# Patient Record
Sex: Female | Born: 1970
Health system: Southern US, Community
[De-identification: ages and names within clinical notes are randomized; demographics above are authoritative.]

## PROBLEM LIST (undated history)

## (undated) DIAGNOSIS — F419 Anxiety disorder, unspecified: Secondary | ICD-10-CM

## (undated) DIAGNOSIS — K219 Gastro-esophageal reflux disease without esophagitis: Secondary | ICD-10-CM

## (undated) DIAGNOSIS — M25569 Pain in unspecified knee: Secondary | ICD-10-CM

## (undated) DIAGNOSIS — M549 Dorsalgia, unspecified: Secondary | ICD-10-CM

## (undated) DIAGNOSIS — J302 Other seasonal allergic rhinitis: Secondary | ICD-10-CM

## (undated) DIAGNOSIS — G8929 Other chronic pain: Secondary | ICD-10-CM

## (undated) HISTORY — PX: TUBAL LIGATION: SHX77

## (undated) HISTORY — PX: KNEE ARTHROSCOPY: SUR90

---

## 2011-09-24 ENCOUNTER — Emergency Department (HOSPITAL_COMMUNITY)
Admission: EM | Admit: 2011-09-24 | Discharge: 2011-09-24 | Disposition: A | Discharge status: Home / Self Care | Payer: 59 | Attending: Emergency Medicine | Admitting: Emergency Medicine

## 2011-09-24 ENCOUNTER — Encounter (HOSPITAL_COMMUNITY): Payer: Self-pay | Admitting: Emergency Medicine

## 2011-09-24 ENCOUNTER — Emergency Department (HOSPITAL_COMMUNITY): Payer: 59

## 2011-09-24 DIAGNOSIS — M25559 Pain in unspecified hip: Secondary | ICD-10-CM | POA: Insufficient documentation

## 2011-09-24 DIAGNOSIS — M25569 Pain in unspecified knee: Secondary | ICD-10-CM | POA: Insufficient documentation

## 2011-09-24 DIAGNOSIS — IMO0002 Reserved for concepts with insufficient information to code with codable children: Secondary | ICD-10-CM

## 2011-09-24 DIAGNOSIS — K219 Gastro-esophageal reflux disease without esophagitis: Secondary | ICD-10-CM | POA: Insufficient documentation

## 2011-09-24 DIAGNOSIS — W19XXXA Unspecified fall, initial encounter: Secondary | ICD-10-CM

## 2011-09-24 DIAGNOSIS — G8929 Other chronic pain: Secondary | ICD-10-CM | POA: Insufficient documentation

## 2011-09-24 HISTORY — DX: Anxiety disorder, unspecified: F41.9

## 2011-09-24 HISTORY — DX: Other seasonal allergic rhinitis: J30.2

## 2011-09-24 HISTORY — DX: Other chronic pain: G89.29

## 2011-09-24 HISTORY — DX: Pain in unspecified knee: M25.569

## 2011-09-24 HISTORY — DX: Gastro-esophageal reflux disease without esophagitis: K21.9
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
## 2011-09-24 NOTE — ED Notes (Signed)
Patient transported to X-ray 

## 2012-01-01 ENCOUNTER — Other Ambulatory Visit (HOSPITAL_COMMUNITY): Payer: Self-pay | Admitting: Pulmonary Disease

## 2012-01-01 DIAGNOSIS — Z1231 Encounter for screening mammogram for malignant neoplasm of breast: Secondary | ICD-10-CM

## 2012-01-05 ENCOUNTER — Ambulatory Visit (HOSPITAL_COMMUNITY)
Admission: RE | Admit: 2012-01-05 | Discharge: 2012-01-05 | Disposition: A | Discharge status: Home / Self Care | Payer: 59 | Source: Ambulatory Visit | Attending: Pulmonary Disease | Admitting: Pulmonary Disease

## 2012-01-05 DIAGNOSIS — Z1231 Encounter for screening mammogram for malignant neoplasm of breast: Secondary | ICD-10-CM | POA: Insufficient documentation

## 2012-03-05 ENCOUNTER — Ambulatory Visit (HOSPITAL_COMMUNITY)
Admission: RE | Admit: 2012-03-05 | Discharge: 2012-03-05 | Disposition: A | Discharge status: Home / Self Care | Payer: 59 | Source: Ambulatory Visit | Attending: Pulmonary Disease | Admitting: Pulmonary Disease

## 2012-03-05 ENCOUNTER — Other Ambulatory Visit (HOSPITAL_COMMUNITY): Payer: Self-pay | Admitting: Pulmonary Disease

## 2012-03-05 DIAGNOSIS — R109 Unspecified abdominal pain: Secondary | ICD-10-CM

## 2012-03-05 MED ORDER — IOHEXOL 300 MG/ML  SOLN
100.0000 mL | Freq: Once | INTRAMUSCULAR | Status: AC | PRN
  Administered 2012-03-05: 100 mL via INTRAVENOUS

## 2012-04-08 ENCOUNTER — Ambulatory Visit (INDEPENDENT_AMBULATORY_CARE_PROVIDER_SITE_OTHER): Payer: 59 | Admitting: Internal Medicine

## 2012-04-16 ENCOUNTER — Ambulatory Visit: Payer: 59 | Admitting: Urology

## 2012-05-09 ENCOUNTER — Ambulatory Visit (HOSPITAL_COMMUNITY)
Admission: RE | Admit: 2012-05-09 | Discharge: 2012-05-09 | Disposition: A | Discharge status: Home / Self Care | Payer: 59 | Source: Ambulatory Visit | Attending: Pulmonary Disease | Admitting: Pulmonary Disease

## 2012-05-09 ENCOUNTER — Encounter (HOSPITAL_COMMUNITY): Payer: Self-pay

## 2012-05-09 ENCOUNTER — Other Ambulatory Visit (HOSPITAL_COMMUNITY): Payer: Self-pay | Admitting: Pulmonary Disease

## 2012-05-09 DIAGNOSIS — R55 Syncope and collapse: Secondary | ICD-10-CM | POA: Insufficient documentation

## 2012-05-09 DIAGNOSIS — R51 Headache: Secondary | ICD-10-CM | POA: Insufficient documentation

## 2012-05-09 DIAGNOSIS — R42 Dizziness and giddiness: Secondary | ICD-10-CM

## 2012-05-21 ENCOUNTER — Ambulatory Visit (INDEPENDENT_AMBULATORY_CARE_PROVIDER_SITE_OTHER): Payer: 59 | Admitting: Internal Medicine

## 2012-05-28 ENCOUNTER — Ambulatory Visit (INDEPENDENT_AMBULATORY_CARE_PROVIDER_SITE_OTHER): Payer: 59 | Admitting: Internal Medicine

## 2012-07-10 ENCOUNTER — Other Ambulatory Visit (HOSPITAL_COMMUNITY): Payer: Self-pay | Admitting: Pulmonary Disease

## 2012-07-10 DIAGNOSIS — M545 Low back pain, unspecified: Secondary | ICD-10-CM

## 2012-07-11 ENCOUNTER — Ambulatory Visit (HOSPITAL_COMMUNITY)
Admission: RE | Admit: 2012-07-11 | Discharge: 2012-07-11 | Disposition: A | Discharge status: Home / Self Care | Payer: 59 | Source: Ambulatory Visit | Attending: Pulmonary Disease | Admitting: Pulmonary Disease

## 2012-07-11 ENCOUNTER — Other Ambulatory Visit (HOSPITAL_COMMUNITY): Payer: Self-pay | Admitting: Pulmonary Disease

## 2012-07-11 DIAGNOSIS — M5126 Other intervertebral disc displacement, lumbar region: Secondary | ICD-10-CM | POA: Insufficient documentation

## 2012-07-11 DIAGNOSIS — M545 Low back pain, unspecified: Secondary | ICD-10-CM

## 2012-07-25 ENCOUNTER — Ambulatory Visit (INDEPENDENT_AMBULATORY_CARE_PROVIDER_SITE_OTHER): Payer: 59 | Admitting: Otolaryngology

## 2012-07-25 DIAGNOSIS — H6120 Impacted cerumen, unspecified ear: Secondary | ICD-10-CM

## 2012-07-25 DIAGNOSIS — H8110 Benign paroxysmal vertigo, unspecified ear: Secondary | ICD-10-CM

## 2012-07-25 DIAGNOSIS — R42 Dizziness and giddiness: Secondary | ICD-10-CM

## 2012-11-26 ENCOUNTER — Encounter (HOSPITAL_COMMUNITY): Payer: Self-pay | Admitting: Nurse Practitioner

## 2012-11-26 ENCOUNTER — Emergency Department (HOSPITAL_COMMUNITY)
Admission: EM | Admit: 2012-11-26 | Discharge: 2012-11-26 | Disposition: A | Discharge status: Home / Self Care | Payer: 59 | Attending: Emergency Medicine | Admitting: Emergency Medicine

## 2012-11-26 DIAGNOSIS — IMO0002 Reserved for concepts with insufficient information to code with codable children: Secondary | ICD-10-CM | POA: Insufficient documentation

## 2012-11-26 DIAGNOSIS — R209 Unspecified disturbances of skin sensation: Secondary | ICD-10-CM | POA: Insufficient documentation

## 2012-11-26 DIAGNOSIS — R52 Pain, unspecified: Secondary | ICD-10-CM | POA: Insufficient documentation

## 2012-11-26 DIAGNOSIS — Z79899 Other long term (current) drug therapy: Secondary | ICD-10-CM | POA: Insufficient documentation

## 2012-11-26 DIAGNOSIS — F411 Generalized anxiety disorder: Secondary | ICD-10-CM | POA: Insufficient documentation

## 2012-11-26 DIAGNOSIS — G8929 Other chronic pain: Secondary | ICD-10-CM | POA: Insufficient documentation

## 2012-11-26 DIAGNOSIS — K219 Gastro-esophageal reflux disease without esophagitis: Secondary | ICD-10-CM | POA: Insufficient documentation

## 2012-11-26 DIAGNOSIS — M5410 Radiculopathy, site unspecified: Secondary | ICD-10-CM

## 2012-11-26 HISTORY — DX: Dorsalgia, unspecified: M54.9

## 2012-11-26 MED ORDER — ONDANSETRON 4 MG PO TBDP
8.0000 mg | ORAL_TABLET | Freq: Once | ORAL | Status: AC
  Administered 2012-11-26: 8 mg via ORAL
  Filled 2012-11-26: qty 2

## 2012-11-26 MED ORDER — DIAZEPAM 5 MG PO TABS: 5.0000 mg | ORAL_TABLET | Freq: Three times a day (TID) | ORAL | Status: DC | PRN

## 2012-11-26 MED ORDER — OXYCODONE-ACETAMINOPHEN 5-325 MG PO TABS: 1.0000 | ORAL_TABLET | ORAL | Status: DC | PRN

## 2012-11-26 MED ORDER — HYDROMORPHONE HCL PF 2 MG/ML IJ SOLN
2.0000 mg | Freq: Once | INTRAMUSCULAR | Status: AC
  Administered 2012-11-26: 2 mg via INTRAMUSCULAR
  Filled 2012-11-26: qty 1

## 2012-11-26 NOTE — ED Provider Notes (Signed)
Medical screening examination/treatment/procedure(s) were performed by non-physician practitioner and as supervising physician I was immediately available for consultation/collaboration.  Shamell Hittle L Lunah Losasso, MD 11/26/12 1647 

## 2012-11-26 NOTE — ED Notes (Signed)
I gave the patient a cup of ice and a ginger-ale. 

## 2012-11-26 NOTE — ED Notes (Signed)
Pt reports she has chronic back pain. She pulled a heavy cooler into the house yesterday and then woke at 0300 with severe lower back pain and numbness and tingling in both feet and down her R leg. Denies bowel/bladder changes. Pt states she can not ambulate due to pain

## 2012-11-26 NOTE — ED Provider Notes (Signed)
CSN: [id number]     Arrival date & time 11/26/12  1129 History     First MD Initiated Contact with Patient 11/26/12 1246     Chief Complaint  Patient presents with  . 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  States she dragged a full cooler yesterday and eacerbated her pain.  Wore up at 3am with significant pain and numbness in bilateral feet.  States she is no longer numb in her feet.  Pain is sharp ,radiates down right leg to level of the knee.  Denies fevers, chills, abdominal pain, N/V/D, change in bowel habits, urinary or vaginal symptoms.  Took home medications without improvement.  Sees back specialist Dr Ollen Bowl.  Patient is a 43 y.o. female presenting with back pain. The history is provided by the patient.  Back Pain Associated symptoms: no abdominal pain, no dysuria and no fever     Past Medical History  Diagnosis Date  . GERD (gastroesophageal reflux disease)   . Anxiety   . Chronic knee pain   . Seasonal allergies   . Back pain    Past Surgical History  Procedure Laterality Date  . Knee arthroscopy    . Tubal ligation     Family History  Problem Relation Age of Onset  . Hypertension Other    History  Substance Use Topics  . Smoking status: Never Smoker   . Smokeless tobacco: Never Used  . Alcohol Use: No   OB History   Grav Para Term Preterm Abortions TAB SAB Ect Mult Living   3 1 1  2  2   1      Review of Systems  Constitutional: Negative for fever.  Gastrointestinal: Negative for nausea, vomiting, abdominal pain and diarrhea.  Genitourinary: Negative for dysuria, urgency, frequency, vaginal bleeding and vaginal discharge.  Musculoskeletal: Positive for back pain.    Allergies  Review of patient's allergies indicates no known allergies.  Home  Medications   Current Outpatient Rx  Name  Route  Sig  Dispense  Refill  . citalopram (CELEXA) 10 MG tablet   Oral   Take 10 mg by mouth daily.         Marland Kitchen esomeprazole (NEXIUM) 40 MG capsule   Oral   Take 40 mg by mouth daily before breakfast.         . HYDROcodone-acetaminophen (NORCO) 5-325 MG per tablet   Oral   Take 1 tablet by mouth as needed. pain         . naproxen sodium (ALEVE) 220 MG tablet   Oral   Take 440 mg by mouth daily.          BP 110/73  Pulse 81  Temp(Src) 98.2 F (36.8 C) (Oral)  Resp 16  Ht 5\' 7"  (1.702 m)  Wt 210 lb (95.255 kg)  BMI 32.88 kg/m2  SpO2 97%  LMP 11/06/2012 Physical Exam  Nursing note and vitals reviewed. Constitutional: She appears well-developed and well-nourished. No distress.  HENT:  Head: Normocephalic and atraumatic.  Neck: Neck supple.  Pulmonary/Chest: Effort normal.  Abdominal: Bowel sounds are normal. She exhibits no distension and no mass. There is no tenderness. There is no rebound and no guarding.  Musculoskeletal:       Back:  Lower extremities:  Strength 5/5, sensation intact, distal pulses intact.    Spine with diffuse tenderness  over lumbar area, no crepitus, or steopoffs. No skin changes overlying spine.     Neurological: She is alert.  Skin: She is not diaphoretic.    ED Course   Procedures (including critical care time)  Labs Reviewed - No data to display No results found.  2:16 PM Pt feeling much better, able to ambulate now.  Ambulates to door and back without difficulty.    1. Radicular low back pain     MDM  Pt with acute on chronic back pain with injury yesterday, dragging heavy cooler.  Initermittent numbness and uncontrolled pain.  Pain is in the same place and same quality as chronic pain, controlled here.  No weakness.  Neurovascularly intact.  No emergent need for imagining today.  Neurosurgical follow up.  Discussed  findings, treatment, follow up  with patient.  Pt given return  precautions.  Pt verbalizes understanding and agrees with plan.     Trixie Dredge, PA-C 11/26/12 1452

## 2012-12-05 ENCOUNTER — Ambulatory Visit (INDEPENDENT_AMBULATORY_CARE_PROVIDER_SITE_OTHER): Payer: 59 | Admitting: Otolaryngology

## 2013-01-29 ENCOUNTER — Ambulatory Visit (HOSPITAL_COMMUNITY)
Admission: RE | Admit: 2013-01-29 | Discharge: 2013-01-29 | Disposition: A | Discharge status: Home / Self Care | Payer: 59 | Source: Ambulatory Visit | Attending: Pulmonary Disease | Admitting: Pulmonary Disease

## 2013-01-29 ENCOUNTER — Other Ambulatory Visit (HOSPITAL_COMMUNITY): Payer: Self-pay | Admitting: Pulmonary Disease

## 2013-01-29 DIAGNOSIS — M25519 Pain in unspecified shoulder: Secondary | ICD-10-CM | POA: Insufficient documentation

## 2013-01-29 DIAGNOSIS — M25511 Pain in right shoulder: Secondary | ICD-10-CM

## 2013-03-29 ENCOUNTER — Emergency Department (HOSPITAL_COMMUNITY)
Admission: EM | Admit: 2013-03-29 | Discharge: 2013-03-29 | Disposition: A | Discharge status: Home / Self Care | Payer: 59 | Source: Home / Self Care | Attending: Family Medicine | Admitting: Family Medicine

## 2013-03-29 ENCOUNTER — Encounter (HOSPITAL_COMMUNITY): Payer: Self-pay | Admitting: Emergency Medicine

## 2013-03-29 DIAGNOSIS — J040 Acute laryngitis: Secondary | ICD-10-CM

## 2013-03-29 LAB — POCT RAPID STREP A: Streptococcus, Group A Screen (Direct): NEGATIVE

## 2013-03-29 MED ORDER — SODIUM CHLORIDE 0.9 % IN NEBU
INHALATION_SOLUTION | RESPIRATORY_TRACT | Status: AC
  Filled 2013-03-29: qty 3

## 2013-03-29 MED ORDER — ALBUTEROL SULFATE (5 MG/ML) 0.5% IN NEBU
5.0000 mg | INHALATION_SOLUTION | Freq: Once | RESPIRATORY_TRACT | Status: AC
  Administered 2013-03-29: 5 mg via RESPIRATORY_TRACT

## 2013-03-29 MED ORDER — METHYLPREDNISOLONE ACETATE 80 MG/ML IJ SUSP
INTRAMUSCULAR | Status: AC
  Filled 2013-03-29: qty 1

## 2013-03-29 MED ORDER — METHYLPREDNISOLONE ACETATE 80 MG/ML IJ SUSP
80.0000 mg | Freq: Once | INTRAMUSCULAR | Status: AC
  Administered 2013-03-29: 80 mg via INTRAMUSCULAR

## 2013-03-29 MED ORDER — GUAIFENESIN-CODEINE 100-10 MG/5ML PO SOLN: 5.0000 mL | Freq: Every evening | ORAL | Status: DC | PRN

## 2013-03-29 MED ORDER — FLUCONAZOLE 150 MG PO TABS: 150.0000 mg | ORAL_TABLET | Freq: Once | ORAL | Status: DC

## 2013-03-29 MED ORDER — ALBUTEROL SULFATE (5 MG/ML) 0.5% IN NEBU: 2.5000 mg | INHALATION_SOLUTION | Freq: Four times a day (QID) | RESPIRATORY_TRACT | Status: DC | PRN

## 2013-03-29 MED ORDER — ALBUTEROL SULFATE (5 MG/ML) 0.5% IN NEBU
INHALATION_SOLUTION | RESPIRATORY_TRACT | Status: AC
  Filled 2013-03-29: qty 1

## 2013-03-29 MED ORDER — IPRATROPIUM BROMIDE 0.02 % IN SOLN
RESPIRATORY_TRACT | Status: AC
  Filled 2013-03-29: qty 2.5

## 2013-03-29 MED ORDER — PREDNISONE 50 MG PO TABS: 50.0000 mg | ORAL_TABLET | Freq: Every day | ORAL | Status: DC

## 2013-03-29 MED ORDER — IPRATROPIUM BROMIDE 0.02 % IN SOLN
0.5000 mg | Freq: Once | RESPIRATORY_TRACT | Status: AC
  Administered 2013-03-29: 0.5 mg via RESPIRATORY_TRACT

## 2013-03-29 MED ORDER — AZITHROMYCIN 250 MG PO TABS: 250.0000 mg | ORAL_TABLET | Freq: Every day | ORAL | Status: DC

## 2013-03-29 NOTE — ED Provider Notes (Addendum)
Sabrina Wood is a 42 y.o. female who presents to Urgent Care today for sore throat cough congestion hoarse voice. This is been present for 6 days. Hoarse voice started yesterday. Patient additionally notes some inspiratory stridor. She had a fever up to 101.7 earlier this week. The fever has resolved. Her primary care provider called her in some Keflex which she has been taking which has not helped. She has been taking Sudafed Mucinex and Robitussin which helped only a little. 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 She has not been to work this week. She denies any nausea vomiting diarrhea or shortness of breath. Additionally she notes a vaginal yeast infection starting since she has been taking antibiotics and would like some Diflucan for this if possible.    Past Medical History  Diagnosis Date  . GERD (gastroesophageal reflux disease)   . Anxiety   . Chronic knee pain   . Seasonal allergies   . Back pain    History  Substance Use Topics  . Smoking status: Never Smoker   . Smokeless tobacco: Never Used  . Alcohol Use: No   ROS as above Medications reviewed. Current Facility-Administered Medications  Medication Dose Route Frequency Provider Last Rate Last Dose  . ipratropium (ATROVENT) nebulizer solution 0.5 mg  0.5 mg Nebulization Once Rodolph Bong, MD       And  . albuterol (PROVENTIL) (5 MG/ML) 0.5% nebulizer solution 5 mg  5 mg Nebulization Once Rodolph Bong, MD      . methylPREDNISolone acetate (DEPO-MEDROL) injection 80 mg  80 mg Intramuscular Once Rodolph Bong, MD       Current Outpatient Prescriptions  Medication Sig Dispense Refill  . Biotin 5000 MCG TABS Take 5,000 mcg by mouth daily.       . citalopram (CELEXA) 20 MG tablet Take 20 mg by mouth daily.      . COCONUT OIL PO Take 1 tablet by mouth daily.       . diazepam (VALIUM) 5 MG tablet Take 1 tablet (5 mg total) by mouth every 8 (eight) hours as needed (muscle spasm or pain).  15  tablet  0  . esomeprazole (NEXIUM) 40 MG capsule Take 40 mg by mouth 2 (two) times daily.       Marland Kitchen HYDROcodone-acetaminophen (NORCO) 7.5-325 MG per tablet Take 1 tablet by mouth every 6 (six) hours as needed for pain.      Marland Kitchen loratadine (CLARITIN) 10 MG tablet Take 10 mg by mouth daily as needed for allergies.      Marland Kitchen oxyCODONE-acetaminophen (PERCOCET) 5-325 MG per tablet Take 1-2 tablets by mouth every 4 (four) hours as needed for pain.  15 tablet  0    Exam:  BP 127/78  Pulse 78  Temp(Src) 98.7 F (37.1 C) (Oral)  Resp 16  SpO2 98% Gen: Well NAD HEENT: EOMI,  MMM, mild inspiratory stridor. Lungs: Normal work of breathing. CTABL no wheezing in the lungs noted Heart: RRR no MRG Abd: NABS, Soft. NT, ND Exts: Non edematous BL  LE, warm and well perfused.   Patient was given a DuoNeb nebulizer treatment, and 80 mg of IM Depo-Medrol, and had some improvement in symptoms  Results for orders placed during the hospital encounter of 03/29/13 (from the past 24 hour(s))  POCT RAPID STREP A ([facility] ONLY)     Status: None   Collection Time    03/29/13  2:09 PM  Result Value Range   Streptococcus, Group A Screen (Direct) NEGATIVE  NEGATIVE   No results found.  Assessment and Plan: 42 y.o. female with laryngitis. This is likely a viral process however I will treat with azithromycin antibiotics to cover atypical organisms. However the most important part of therapy will be prednisone. Will use this as well as codeine containing cough medication. I provided patient with home nebulizer albuterol rx.  Follow up with PCP Discussed warning signs or symptoms. Please see discharge instructions. Patient expresses understanding.   Additionally we'll prescribe fluconazole for patient's yeast infection.  Rodolph Bong, MD 03/29/13 1501  Rodolph Bong, MD 03/29/13 [phone]

## 2013-03-29 NOTE — ED Notes (Signed)
Sore throat since 12-15; her MD called in Rx w/o exam, and has now developed yeast infection and has lost her voice due to cough

## 2013-03-31 LAB — CULTURE, GROUP A STREP

## 2013-04-15 ENCOUNTER — Ambulatory Visit (HOSPITAL_COMMUNITY)
Admission: RE | Admit: 2013-04-15 | Discharge: 2013-04-15 | Disposition: A | Discharge status: Home / Self Care | Payer: 59 | Source: Ambulatory Visit | Attending: Pulmonary Disease | Admitting: Pulmonary Disease

## 2013-04-15 ENCOUNTER — Other Ambulatory Visit (HOSPITAL_COMMUNITY): Payer: Self-pay | Admitting: Pulmonary Disease

## 2013-04-15 ENCOUNTER — Encounter (HOSPITAL_COMMUNITY): Payer: Self-pay

## 2013-04-15 DIAGNOSIS — R51 Headache: Principal | ICD-10-CM

## 2013-04-15 DIAGNOSIS — R519 Headache, unspecified: Secondary | ICD-10-CM

## 2013-04-15 DIAGNOSIS — R569 Unspecified convulsions: Secondary | ICD-10-CM | POA: Insufficient documentation

## 2013-04-16 ENCOUNTER — Ambulatory Visit (HOSPITAL_COMMUNITY): Payer: 59

## 2013-04-29 ENCOUNTER — Other Ambulatory Visit (HOSPITAL_COMMUNITY): Payer: Self-pay | Admitting: Pulmonary Disease

## 2013-04-29 DIAGNOSIS — M25511 Pain in right shoulder: Secondary | ICD-10-CM

## 2013-04-29 DIAGNOSIS — M25569 Pain in unspecified knee: Secondary | ICD-10-CM

## 2013-04-29 DIAGNOSIS — M1990 Unspecified osteoarthritis, unspecified site: Secondary | ICD-10-CM

## 2013-04-29 DIAGNOSIS — G8929 Other chronic pain: Secondary | ICD-10-CM

## 2013-04-29 DIAGNOSIS — M549 Dorsalgia, unspecified: Secondary | ICD-10-CM

## 2013-05-02 ENCOUNTER — Encounter (HOSPITAL_COMMUNITY): Payer: Self-pay

## 2013-05-02 ENCOUNTER — Ambulatory Visit (HOSPITAL_COMMUNITY)
Admission: RE | Admit: 2013-05-02 | Discharge: 2013-05-02 | Disposition: A | Discharge status: Home / Self Care | Payer: 59 | Source: Ambulatory Visit | Attending: Pulmonary Disease | Admitting: Pulmonary Disease

## 2013-05-02 DIAGNOSIS — M25569 Pain in unspecified knee: Principal | ICD-10-CM

## 2013-05-02 DIAGNOSIS — M25511 Pain in right shoulder: Secondary | ICD-10-CM

## 2013-05-02 DIAGNOSIS — M1990 Unspecified osteoarthritis, unspecified site: Secondary | ICD-10-CM

## 2013-05-02 DIAGNOSIS — M259 Joint disorder, unspecified: Secondary | ICD-10-CM | POA: Insufficient documentation

## 2013-05-02 DIAGNOSIS — M549 Dorsalgia, unspecified: Secondary | ICD-10-CM

## 2013-05-02 DIAGNOSIS — M48061 Spinal stenosis, lumbar region without neurogenic claudication: Secondary | ICD-10-CM | POA: Insufficient documentation

## 2013-05-02 DIAGNOSIS — M5124 Other intervertebral disc displacement, thoracic region: Secondary | ICD-10-CM | POA: Insufficient documentation

## 2013-05-02 DIAGNOSIS — G8929 Other chronic pain: Secondary | ICD-10-CM

## 2013-05-02 DIAGNOSIS — M542 Cervicalgia: Secondary | ICD-10-CM | POA: Insufficient documentation

## 2013-05-02 DIAGNOSIS — M5030 Other cervical disc degeneration, unspecified cervical region: Secondary | ICD-10-CM | POA: Insufficient documentation

## 2013-05-02 DIAGNOSIS — M545 Low back pain, unspecified: Secondary | ICD-10-CM | POA: Insufficient documentation

## 2013-05-02 DIAGNOSIS — M5126 Other intervertebral disc displacement, lumbar region: Secondary | ICD-10-CM | POA: Insufficient documentation

## 2013-05-02 DIAGNOSIS — M5137 Other intervertebral disc degeneration, lumbosacral region: Secondary | ICD-10-CM | POA: Insufficient documentation

## 2013-05-02 DIAGNOSIS — M51379 Other intervertebral disc degeneration, lumbosacral region without mention of lumbar back pain or lower extremity pain: Secondary | ICD-10-CM | POA: Insufficient documentation

## 2013-05-02 DIAGNOSIS — M5020 Other cervical disc displacement, unspecified cervical region: Secondary | ICD-10-CM | POA: Insufficient documentation

## 2014-02-02 ENCOUNTER — Other Ambulatory Visit (HOSPITAL_COMMUNITY): Payer: Self-pay | Admitting: Pulmonary Disease

## 2014-02-02 DIAGNOSIS — Z1231 Encounter for screening mammogram for malignant neoplasm of breast: Secondary | ICD-10-CM

## 2014-02-09 ENCOUNTER — Encounter (HOSPITAL_COMMUNITY): Payer: Self-pay

## 2014-03-02 ENCOUNTER — Ambulatory Visit (HOSPITAL_COMMUNITY): Payer: 59

## 2014-03-18 ENCOUNTER — Ambulatory Visit (HOSPITAL_COMMUNITY): Payer: 59

## 2014-03-18 ENCOUNTER — Ambulatory Visit (HOSPITAL_COMMUNITY)
Admission: RE | Admit: 2014-03-18 | Discharge: 2014-03-18 | Disposition: A | Discharge status: Home / Self Care | Payer: 59 | Source: Ambulatory Visit | Attending: Pulmonary Disease | Admitting: Pulmonary Disease

## 2014-03-18 DIAGNOSIS — Z1231 Encounter for screening mammogram for malignant neoplasm of breast: Secondary | ICD-10-CM | POA: Diagnosis present

## 2014-06-19 ENCOUNTER — Other Ambulatory Visit (HOSPITAL_COMMUNITY): Payer: Self-pay | Admitting: Pulmonary Disease

## 2014-06-19 ENCOUNTER — Ambulatory Visit (HOSPITAL_COMMUNITY)
Admission: RE | Admit: 2014-06-19 | Discharge: 2014-06-19 | Disposition: A | Discharge status: Home / Self Care | Payer: 59 | Source: Ambulatory Visit | Attending: Pulmonary Disease | Admitting: Pulmonary Disease

## 2014-06-19 DIAGNOSIS — W19XXXA Unspecified fall, initial encounter: Secondary | ICD-10-CM | POA: Diagnosis not present

## 2014-06-19 DIAGNOSIS — S8991XA Unspecified injury of right lower leg, initial encounter: Secondary | ICD-10-CM | POA: Insufficient documentation

## 2014-07-13 ENCOUNTER — Encounter (HOSPITAL_COMMUNITY): Payer: Self-pay | Admitting: Emergency Medicine

## 2014-07-13 ENCOUNTER — Emergency Department (HOSPITAL_COMMUNITY)
Admission: EM | Admit: 2014-07-13 | Discharge: 2014-07-13 | Disposition: A | Discharge status: Home / Self Care | Payer: 59 | Attending: Emergency Medicine | Admitting: Emergency Medicine

## 2014-07-13 ENCOUNTER — Emergency Department (HOSPITAL_COMMUNITY): Payer: 59

## 2014-07-13 DIAGNOSIS — Z7952 Long term (current) use of systemic steroids: Secondary | ICD-10-CM | POA: Insufficient documentation

## 2014-07-13 DIAGNOSIS — B349 Viral infection, unspecified: Secondary | ICD-10-CM | POA: Insufficient documentation

## 2014-07-13 DIAGNOSIS — K219 Gastro-esophageal reflux disease without esophagitis: Secondary | ICD-10-CM | POA: Insufficient documentation

## 2014-07-13 DIAGNOSIS — Z79899 Other long term (current) drug therapy: Secondary | ICD-10-CM | POA: Insufficient documentation

## 2014-07-13 DIAGNOSIS — F419 Anxiety disorder, unspecified: Secondary | ICD-10-CM | POA: Diagnosis not present

## 2014-07-13 DIAGNOSIS — G8929 Other chronic pain: Secondary | ICD-10-CM | POA: Insufficient documentation

## 2014-07-13 DIAGNOSIS — R1110 Vomiting, unspecified: Secondary | ICD-10-CM | POA: Diagnosis present

## 2014-07-13 DIAGNOSIS — R059 Cough, unspecified: Secondary | ICD-10-CM

## 2014-07-13 DIAGNOSIS — R05 Cough: Secondary | ICD-10-CM

## 2014-07-13 LAB — CBC WITH DIFFERENTIAL/PLATELET
Basophils Absolute: 0 10*3/uL (ref 0.0–0.1)
Basophils Relative: 1 % (ref 0–1)
Eosinophils Absolute: 0.1 10*3/uL (ref 0.0–0.7)
Eosinophils Relative: 2 % (ref 0–5)
HCT: 40 % (ref 36.0–46.0)
Hemoglobin: 13.4 g/dL (ref 12.0–15.0)
Lymphocytes Relative: 24 % (ref 12–46)
Lymphs Abs: 1 10*3/uL (ref 0.7–4.0)
MCH: 31.3 pg (ref 26.0–34.0)
MCHC: 33.5 g/dL (ref 30.0–36.0)
MCV: 93.5 fL (ref 78.0–100.0)
Monocytes Absolute: 0.8 10*3/uL (ref 0.1–1.0)
Monocytes Relative: 20 % — ABNORMAL HIGH (ref 3–12)
Neutro Abs: 2.3 10*3/uL (ref 1.7–7.7)
Neutrophils Relative %: 53 % (ref 43–77)
Platelets: 317 10*3/uL (ref 150–400)
RBC: 4.28 MIL/uL (ref 3.87–5.11)
RDW: 12.8 % (ref 11.5–15.5)
WBC: 4.3 10*3/uL (ref 4.0–10.5)

## 2014-07-13 LAB — COMPREHENSIVE METABOLIC PANEL
ALT: 13 U/L (ref 0–35)
AST: 16 U/L (ref 0–37)
Albumin: 3.7 g/dL (ref 3.5–5.2)
Alkaline Phosphatase: 61 U/L (ref 39–117)
Anion gap: 7 (ref 5–15)
BUN: 7 mg/dL (ref 6–23)
CO2: 25 mmol/L (ref 19–32)
Calcium: 8.7 mg/dL (ref 8.4–10.5)
Chloride: 107 mmol/L (ref 96–112)
Creatinine, Ser: 0.77 mg/dL (ref 0.50–1.10)
GFR calc Af Amer: >90 mL/min (ref >90)
GFR calc non Af Amer: >90 mL/min (ref >90)
Glucose, Bld: 97 mg/dL (ref 70–99)
Potassium: 3.6 mmol/L (ref 3.5–5.1)
Sodium: 139 mmol/L (ref 135–145)
Total Bilirubin: 0.4 mg/dL (ref 0.3–1.2)
Total Protein: 7.1 g/dL (ref 6.0–8.3)

## 2014-07-13 LAB — URINALYSIS, ROUTINE W REFLEX MICROSCOPIC
Bilirubin Urine: NEGATIVE
Glucose, UA: NEGATIVE mg/dL
Ketones, ur: NEGATIVE mg/dL
Leukocytes, UA: NEGATIVE
Nitrite: NEGATIVE
Protein, ur: NEGATIVE mg/dL
Specific Gravity, Urine: >1.03 — ABNORMAL HIGH (ref 1.005–1.030)
Urobilinogen, UA: 0.2 mg/dL (ref 0.0–1.0)
pH: 6 (ref 5.0–8.0)

## 2014-07-13 LAB — LIPASE, BLOOD: Lipase: 22 U/L (ref 11–59)

## 2014-07-13 LAB — URINE MICROSCOPIC-ADD ON

## 2014-07-13 MED ORDER — DIPHENHYDRAMINE HCL 50 MG/ML IJ SOLN
25.0000 mg | Freq: Once | INTRAMUSCULAR | Status: AC
  Administered 2014-07-13: 25 mg via INTRAVENOUS
  Filled 2014-07-13: qty 1

## 2014-07-13 MED ORDER — SODIUM CHLORIDE 0.9 % IV SOLN
INTRAVENOUS | Status: DC
  Administered 2014-07-13: 12:00:00 via INTRAVENOUS

## 2014-07-13 MED ORDER — METOCLOPRAMIDE HCL 5 MG/ML IJ SOLN
10.0000 mg | Freq: Once | INTRAMUSCULAR | Status: AC
  Administered 2014-07-13: 10 mg via INTRAVENOUS
  Filled 2014-07-13: qty 2

## 2014-07-13 MED ORDER — ONDANSETRON HCL 4 MG/2ML IJ SOLN
4.0000 mg | Freq: Once | INTRAMUSCULAR | Status: AC
  Administered 2014-07-13: 4 mg via INTRAVENOUS
  Filled 2014-07-13: qty 2

## 2014-07-13 MED ORDER — KETOROLAC TROMETHAMINE 30 MG/ML IJ SOLN
30.0000 mg | Freq: Once | INTRAMUSCULAR | Status: AC
  Administered 2014-07-13: 30 mg via INTRAVENOUS
  Filled 2014-07-13: qty 1

## 2014-07-13 MED ORDER — SODIUM CHLORIDE 0.9 % IV BOLUS (SEPSIS)
1000.0000 mL | Freq: Once | INTRAVENOUS | Status: AC
  Administered 2014-07-13: 1000 mL via INTRAVENOUS

## 2014-07-13 NOTE — Discharge Instructions (Signed)

## 2014-07-13 NOTE — ED Notes (Signed)
PT c/o vomiting since 07/10/14 but denies any diarrhea and c/o of low grade fever. PT states husband with same symptoms last week.

## 2014-07-13 NOTE — ED Provider Notes (Signed)
CSN: [id number]     Arrival date & time 07/13/14  1059 History  This chart was scribed for Sabrina Nick, MD by Abel Presto, ED Scribe. This patient was seen in room APA08/APA08 and the patient's care was started at 12:32 PM.    Chief Complaint  Patient presents with  . Emesis    Patient is a 44 y.o. female presenting with vomiting. The history is provided by the patient. No language interpreter was used.  Emesis Associated symptoms: headaches and sore throat    HPI Comments: Sabrina Wood is a 45 y.o. female with PMHx of migraines who presents to the Emergency Department complaining of vomiting 5-6 times a day with onset 3 days ago. Pt notes associated fever at highest 102.1, congestion, headache, and cough, sore throat, and mild photophobia. No dyspnea. Pt states she feels like she has the flu. Pt tried Pepto Bismol, Imitrex, and Wal Phed PE for relief. Pt denies diarrhea, ear pain, and neck pain.no rashes noted. Pt notes positive sick exposures.  Past Medical History  Diagnosis Date  . GERD (gastroesophageal reflux disease)   . Anxiety   . Chronic knee pain   . Seasonal allergies   . Back pain    Past Surgical History  Procedure Laterality Date  . Knee arthroscopy    . Tubal ligation     Family History  Problem Relation Age of Onset  . Hypertension Other    History  Substance Use Topics  . Smoking status: Never Smoker   . Smokeless tobacco: Never Used  . Alcohol Use: No   OB History    Gravida Para Term Preterm AB TAB SAB Ectopic Multiple Living   Review of Systems  Constitutional: Positive for fever.  HENT: Positive for congestion and sore throat.   Eyes: Positive for photophobia.  Respiratory: Positive for cough.   Gastrointestinal: Positive for vomiting.  Neurological: Positive for headaches.  All other systems reviewed and are negative.     Allergies  Morphine and related  Home Medications   Prior to Admission medications    Medication Sig Start Date End Date Taking? Authorizing Provider  ALPRAZolam Prudy Feeler) 0.5 MG tablet Take 0.5 mg by mouth daily as needed for anxiety.   Yes Historical Provider, MD  Biotin 5000 MCG TABS Take 5,000 mcg by mouth daily.    Yes Historical Provider, MD  citalopram (CELEXA) 20 MG tablet Take 20 mg by mouth daily.   Yes Historical Provider, MD  COCONUT OIL PO Take 1 tablet by mouth daily.    Yes Historical Provider, MD  esomeprazole (NEXIUM) 40 MG capsule Take 40 mg by mouth 2 (two) times daily.    Yes Historical Provider, MD  loratadine (CLARITIN) 10 MG tablet Take 10 mg by mouth daily as needed for allergies.   Yes Historical Provider, MD  albuterol (PROVENTIL) (5 MG/ML) 0.5% nebulizer solution Take 0.5 mLs (2.5 mg total) by nebulization every 6 (six) hours as needed for wheezing or shortness of breath. Patient not taking: Reported on 07/13/2014 03/29/13   Rodolph Bong, MD  azithromycin (ZITHROMAX) 250 MG tablet Take 1 tablet (250 mg total) by mouth daily. Take first 2 tablets together, then 1 every day until finished. Patient not taking: Reported on 07/13/2014 03/29/13   Rodolph Bong, MD  diazepam (VALIUM) 5 MG tablet Take 1 tablet (5 mg total) by mouth every 8 (eight) hours as needed (muscle  spasm or pain). Patient not taking: Reported on 07/13/2014 11/26/12   Trixie DredgeEmily West, PA-C  fluconazole (DIFLUCAN) 150 MG tablet Take 1 tablet (150 mg total) by mouth once. Patient not taking: Reported on 07/13/2014 03/29/13   Rodolph BongEvan S Corey, MD  guaiFENesin-codeine 100-10 MG/5ML syrup Take 5 mLs by mouth at bedtime as needed for cough. Patient not taking: Reported on 07/13/2014 03/29/13   Rodolph BongEvan S Corey, MD  predniSONE (DELTASONE) 50 MG tablet Take 1 tablet (50 mg total) by mouth daily. Patient not taking: Reported on 07/13/2014 03/29/13   Rodolph BongEvan S Corey, MD   BP 127/80 mmHg  Pulse 100  Temp(Src) 97.8 F (36.6 C) (Oral)  Resp 18  Ht 5\' 6"  (1.676 m)  Wt 220 lb (99.791 kg)  BMI 35.53 kg/m2  SpO2 99%  LMP  06/30/2014 Physical Exam  Constitutional: She is oriented to person, place, and time. She appears well-developed and well-nourished.  Non-toxic appearance. No distress.  HENT:  Head: Normocephalic and atraumatic.  Eyes: Conjunctivae, EOM and lids are normal. Pupils are equal, round, and reactive to light.  Neck: Normal range of motion. Neck supple. No tracheal deviation present. No Brudzinski's sign and no Kernig's sign noted. No thyroid mass present.  No meningismus  Cardiovascular: Normal rate, regular rhythm and normal heart sounds.  Exam reveals no gallop.   No murmur heard. Pulmonary/Chest: Effort normal and breath sounds normal. No stridor. No respiratory distress. She has no decreased breath sounds. She has no wheezes. She has no rhonchi. She has no rales.  Abdominal: Soft. Normal appearance and bowel sounds are normal. She exhibits no distension. There is no tenderness. There is no rebound and no CVA tenderness.  Musculoskeletal: Normal range of motion. She exhibits no edema or tenderness.  Neurological: She is alert and oriented to person, place, and time. She has normal strength. No cranial nerve deficit or sensory deficit. GCS eye subscore is 4. GCS verbal subscore is 5. GCS motor subscore is 6.  Skin: Skin is warm and dry. No abrasion and no rash noted.  Psychiatric: She has a normal mood and affect. Her speech is normal and behavior is normal.  Nursing note and vitals reviewed.   ED Course  Procedures (including critical care time) DIAGNOSTIC STUDIES: Oxygen Saturation is 99% on room air, normal by my interpretation.    COORDINATION OF CARE: 12:37 PM Discussed treatment plan with patient at beside, the patient agrees with the plan and has no further questions at this time.   Labs Review Labs Reviewed  CBC WITH DIFFERENTIAL/PLATELET - Abnormal; Notable for the following:    Monocytes Relative 20 (*)    All other components within normal limits  COMPREHENSIVE METABOLIC  PANEL  LIPASE, BLOOD  URINALYSIS, ROUTINE W REFLEX MICROSCOPIC    Imaging Review No results found.   EKG Interpretation None      MDM   Final diagnoses:  Cough    I personally performed the services described in this documentation, which was scribed in my presence. The recorded information has been reviewed and is accurate.    Patient given IV fluids and medications and feels less better at this time. No evidence of meningitis at this time. Suspect viral illness. Stable for discharge  Sabrina NickAnthony Myeisha Kruser, MD 07/13/14 1419

## 2015-02-08 ENCOUNTER — Other Ambulatory Visit (HOSPITAL_COMMUNITY): Payer: Self-pay | Admitting: Pulmonary Disease

## 2015-02-08 DIAGNOSIS — Z1231 Encounter for screening mammogram for malignant neoplasm of breast: Secondary | ICD-10-CM

## 2015-03-26 ENCOUNTER — Ambulatory Visit (HOSPITAL_COMMUNITY)
Admission: RE | Admit: 2015-03-26 | Discharge: 2015-03-26 | Disposition: A | Discharge status: Home / Self Care | Payer: 59 | Source: Ambulatory Visit | Attending: Pulmonary Disease | Admitting: Pulmonary Disease

## 2015-03-26 DIAGNOSIS — Z1231 Encounter for screening mammogram for malignant neoplasm of breast: Secondary | ICD-10-CM | POA: Diagnosis not present

## 2015-05-24 MED FILL — CITALOPRAM HBR 20 MG TABLET: 20 | 90 days supply | Qty: 180 | Fill #0

## 2015-05-28 MED FILL — ALPRAZolam 1 MG TABS: 1 | 90 days supply | Qty: 180 | Fill #0

## 2015-07-30 MED FILL — ESOMEPRAZOLE MAG DR 40 MG C: 40 | 90 days supply | Qty: 180 | Fill #0

## 2015-09-08 MED FILL — CITALOPRAM HBR 20 MG TABLET: 20 | 90 days supply | Qty: 180 | Fill #1

## 2015-12-31 MED FILL — CITALOPRAM HBR 20 MG TABLET: 20 | 90 days supply | Qty: 180 | Fill #0 | Status: TO

## 2015-12-31 MED FILL — ESOMEPRAZOLE MAG DR 40 MG C: 40 | 90 days supply | Qty: 180 | Fill #1

## 2016-02-04 ENCOUNTER — Other Ambulatory Visit (HOSPITAL_COMMUNITY): Payer: Self-pay | Admitting: Pulmonary Disease

## 2016-02-04 DIAGNOSIS — Z1231 Encounter for screening mammogram for malignant neoplasm of breast: Secondary | ICD-10-CM

## 2016-02-10 ENCOUNTER — Ambulatory Visit (HOSPITAL_COMMUNITY): Payer: 59

## 2016-03-06 ENCOUNTER — Other Ambulatory Visit (HOSPITAL_COMMUNITY): Payer: Self-pay | Admitting: Pulmonary Disease

## 2016-03-06 DIAGNOSIS — Z1231 Encounter for screening mammogram for malignant neoplasm of breast: Secondary | ICD-10-CM

## 2016-03-31 ENCOUNTER — Ambulatory Visit (HOSPITAL_COMMUNITY): Payer: 59

## 2016-04-06 ENCOUNTER — Ambulatory Visit (HOSPITAL_COMMUNITY): Payer: 59

## 2016-05-18 MED FILL — ALPRAZolam 1 MG TABS: 1 | 90 days supply | Qty: 180 | Fill #0 | Status: TO

## 2016-05-18 MED FILL — ESOMEPRAZOLE MAG DR 40 MG C: 40 | 90 days supply | Qty: 180 | Fill #2 | Status: TO

## 2016-11-01 MED FILL — ESOMEPRAZOLE MAG DR 40 MG C: 40 | 90 days supply | Qty: 180 | Fill #0 | Status: TO

## 2016-11-03 ENCOUNTER — Ambulatory Visit (HOSPITAL_COMMUNITY)
Admission: RE | Admit: 2016-11-03 | Discharge: 2016-11-03 | Disposition: A | Discharge status: Home / Self Care | Payer: 59 | Source: Ambulatory Visit | Attending: Pulmonary Disease | Admitting: Pulmonary Disease

## 2016-11-03 DIAGNOSIS — Z1231 Encounter for screening mammogram for malignant neoplasm of breast: Secondary | ICD-10-CM | POA: Insufficient documentation

## 2016-12-06 MED FILL — ALPRAZolam 1 MG TABS: 1 | 90 days supply | Qty: 180 | Fill #0

## 2017-03-21 MED FILL — ESOMEPRAZOLE MAG DR 40 MG C: 40 | 90 days supply | Qty: 180 | Fill #1 | Status: TO

## 2017-05-08 MED FILL — ALPRAZolam 1 MG TABS: 1 | 90 days supply | Qty: 180 | Fill #1

## 2017-06-13 ENCOUNTER — Ambulatory Visit (HOSPITAL_COMMUNITY)
Admission: RE | Admit: 2017-06-13 | Discharge: 2017-06-13 | Disposition: A | Discharge status: Home / Self Care | Payer: No Typology Code available for payment source | Source: Ambulatory Visit | Attending: Pulmonary Disease | Admitting: Pulmonary Disease

## 2017-06-13 ENCOUNTER — Other Ambulatory Visit (HOSPITAL_COMMUNITY): Payer: Self-pay | Admitting: Pulmonary Disease

## 2017-06-13 DIAGNOSIS — R52 Pain, unspecified: Secondary | ICD-10-CM

## 2017-06-13 DIAGNOSIS — M25561 Pain in right knee: Secondary | ICD-10-CM | POA: Insufficient documentation

## 2017-06-13 DIAGNOSIS — M179 Osteoarthritis of knee, unspecified: Secondary | ICD-10-CM | POA: Diagnosis not present

## 2017-08-07 MED FILL — AZITHROMYCIN 250 MG TABLET: 250 | 5 days supply | Qty: 6 | Fill #0

## 2017-08-07 MED FILL — FLUCONAZOLE 150 MG TABS: 150 | 2 days supply | Qty: 2 | Fill #0

## 2017-08-14 MED FILL — ALPRAZolam 1 MG TABS: 1 | 90 days supply | Qty: 180 | Fill #0

## 2017-08-15 ENCOUNTER — Other Ambulatory Visit (HOSPITAL_COMMUNITY)
Admission: RE | Admit: 2017-08-15 | Discharge: 2017-08-15 | Disposition: A | Discharge status: Home / Self Care | Payer: No Typology Code available for payment source | Source: Ambulatory Visit | Attending: Pulmonary Disease | Admitting: Pulmonary Disease

## 2017-08-15 DIAGNOSIS — M5117 Intervertebral disc disorders with radiculopathy, lumbosacral region: Secondary | ICD-10-CM | POA: Diagnosis present

## 2017-08-15 DIAGNOSIS — F329 Major depressive disorder, single episode, unspecified: Secondary | ICD-10-CM | POA: Insufficient documentation

## 2017-08-15 DIAGNOSIS — Z0000 Encounter for general adult medical examination without abnormal findings: Secondary | ICD-10-CM | POA: Insufficient documentation

## 2017-08-15 DIAGNOSIS — K210 Gastro-esophageal reflux disease with esophagitis: Secondary | ICD-10-CM | POA: Diagnosis not present

## 2017-08-15 LAB — COMPREHENSIVE METABOLIC PANEL
ALT: 17 U/L (ref 14–54)
AST: 18 U/L (ref 15–41)
Albumin: 4.2 g/dL (ref 3.5–5.0)
Alkaline Phosphatase: 70 U/L (ref 38–126)
Anion gap: 8 (ref 5–15)
BUN: 12 mg/dL (ref 6–20)
CO2: 28 mmol/L (ref 22–32)
Calcium: 9.4 mg/dL (ref 8.9–10.3)
Chloride: 104 mmol/L (ref 101–111)
Creatinine, Ser: 0.8 mg/dL (ref 0.44–1.00)
GFR calc Af Amer: >60 mL/min (ref >60)
GFR calc non Af Amer: >60 mL/min (ref >60)
Glucose, Bld: 96 mg/dL (ref 65–99)
Potassium: 4.5 mmol/L (ref 3.5–5.1)
Sodium: 140 mmol/L (ref 135–145)
Total Bilirubin: 0.8 mg/dL (ref 0.3–1.2)
Total Protein: 7.3 g/dL (ref 6.5–8.1)

## 2017-08-15 LAB — CBC
HCT: 41.3 % (ref 36.0–46.0)
Hemoglobin: 13.5 g/dL (ref 12.0–15.0)
MCH: 30.6 pg (ref 26.0–34.0)
MCHC: 32.7 g/dL (ref 30.0–36.0)
MCV: 93.7 fL (ref 78.0–100.0)
Platelets: 381 10*3/uL (ref 150–400)
RBC: 4.41 MIL/uL (ref 3.87–5.11)
RDW: 12.9 % (ref 11.5–15.5)
WBC: 6.7 10*3/uL (ref 4.0–10.5)

## 2017-08-15 LAB — LIPID PANEL
Cholesterol: 154 mg/dL (ref 0–200)
HDL: 68 mg/dL (ref >40)
LDL Cholesterol: 77 mg/dL (ref 0–99)
Total CHOL/HDL Ratio: 2.3 RATIO
Triglycerides: 43 mg/dL (ref <150)
VLDL: 9 mg/dL (ref 0–40)

## 2017-08-15 LAB — TSH: TSH: 0.521 u[IU]/mL (ref 0.350–4.500)

## 2017-09-05 MED FILL — ESOMEPRAZOLE MAG DR 40 MG C: 40 | 90 days supply | Qty: 180 | Fill #2 | Status: TO

## 2017-09-20 ENCOUNTER — Other Ambulatory Visit (HOSPITAL_COMMUNITY): Payer: Self-pay | Admitting: Pulmonary Disease

## 2017-09-20 DIAGNOSIS — Z1231 Encounter for screening mammogram for malignant neoplasm of breast: Secondary | ICD-10-CM

## 2017-11-13 MED FILL — ALPRAZolam 1 MG TABS: 1 | 90 days supply | Qty: 180 | Fill #1

## 2017-12-18 MED FILL — GABAPENTIN 100 MG CAP: 100 | 90 days supply | Qty: 360 | Fill #0

## 2018-02-20 MED FILL — ESOMEPRAZOLE MAG DR 40 MG C: 40 | 90 days supply | Qty: 180 | Fill #0 | Status: TO

## 2018-02-20 MED FILL — ALPRAZolam 1 MG TABS: 1 | 90 days supply | Qty: 180 | Fill #0

## 2018-02-20 MED FILL — AZITHROMYCIN 250 MG TABLET: 250 | 5 days supply | Qty: 6 | Fill #1

## 2018-02-20 MED FILL — AMOXICILLIN 500 MG CAPSULE: 500 | 10 days supply | Qty: 30 | Fill #0

## 2018-03-20 MED FILL — GABAPENTIN 100 MG CAPS: 100 | 90 days supply | Qty: 360 | Fill #1

## 2018-06-05 MED FILL — ALPRAZolam 1 MG TABS: 1 | 90 days supply | Qty: 180 | Fill #1

## 2018-06-25 MED FILL — AMOXICILLIN 500 MG CAPSULE: 500 | 10 days supply | Qty: 30 | Fill #1

## 2018-06-25 MED FILL — ESOMEPRAZOLE MAG DR 40 MG C: 40 | 90 days supply | Qty: 180 | Fill #1 | Status: TO

## 2018-06-25 MED FILL — GABAPENTIN 100 MG CAPSULE: 100 | 90 days supply | Qty: 360 | Fill #2

## 2018-06-25 MED FILL — AZITHROMYCIN 250 MG TABLET: 250 | 5 days supply | Qty: 6 | Fill #0

## 2018-07-04 ENCOUNTER — Encounter (INDEPENDENT_AMBULATORY_CARE_PROVIDER_SITE_OTHER): Payer: Self-pay | Admitting: Internal Medicine

## 2018-09-03 ENCOUNTER — Ambulatory Visit (INDEPENDENT_AMBULATORY_CARE_PROVIDER_SITE_OTHER): Payer: No Typology Code available for payment source | Admitting: Internal Medicine

## 2018-09-06 ENCOUNTER — Ambulatory Visit (HOSPITAL_COMMUNITY)
Admission: RE | Admit: 2018-09-06 | Discharge: 2018-09-06 | Disposition: A | Discharge status: Home / Self Care | Payer: No Typology Code available for payment source | Source: Ambulatory Visit | Attending: Internal Medicine | Admitting: Internal Medicine

## 2018-09-06 ENCOUNTER — Other Ambulatory Visit (HOSPITAL_COMMUNITY): Payer: Self-pay | Admitting: Internal Medicine

## 2018-09-06 ENCOUNTER — Other Ambulatory Visit: Payer: Self-pay

## 2018-09-06 DIAGNOSIS — Z1231 Encounter for screening mammogram for malignant neoplasm of breast: Secondary | ICD-10-CM | POA: Insufficient documentation

## 2018-11-20 MED FILL — AZITHROMYCIN 250 MG TABLET: 250 | 5 days supply | Qty: 6 | Fill #1

## 2018-11-21 MED FILL — ALPRAZolam 1 MG TABS: 1 | 90 days supply | Qty: 180 | Fill #0

## 2018-11-21 MED FILL — AMOXICILLIN 500 MG CAPSULE: 500 | 10 days supply | Qty: 30 | Fill #0

## 2018-12-18 ENCOUNTER — Ambulatory Visit (INDEPENDENT_AMBULATORY_CARE_PROVIDER_SITE_OTHER): Payer: No Typology Code available for payment source | Admitting: Family Medicine

## 2018-12-18 ENCOUNTER — Other Ambulatory Visit: Payer: Self-pay

## 2018-12-18 VITALS — BP 117/77 | HR 92 | Temp 97.9°F | Ht 67.0 in | Wt 194.2 lb

## 2018-12-18 DIAGNOSIS — K219 Gastro-esophageal reflux disease without esophagitis: Secondary | ICD-10-CM | POA: Diagnosis not present

## 2018-12-18 DIAGNOSIS — E569 Vitamin deficiency, unspecified: Secondary | ICD-10-CM | POA: Diagnosis not present

## 2018-12-18 NOTE — Progress Notes (Signed)
New Patient Office Visit  Subjective:  Patient ID: Sabrina Wood, female    DOB: 10/19/1970  Age: 48 y.o. MRN: [id number]  CC:  Chief Complaint  Patient presents with  . New Patient (Initial Visit)    HPI Sabrina Wood presents for GERD-takes Nexium labwork completed-Vit D slightly low-Dr. Anastasio Champion   Past Medical History:  Diagnosis Date  . Anxiety   . Back pain   . Chronic knee pain   . GERD (gastroesophageal reflux disease)   . Seasonal allergies     Past Surgical History:  Procedure Laterality Date  . KNEE ARTHROSCOPY    . TUBAL LIGATION      Family History  Problem Relation Age of Onset  . 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 Marital status: Single    Spouse name: Not on file  . Number of children: Not on file  . Years of education: Not on file  . Highest education level: Not on file  Occupational History  . Not on file  Social Needs  . Financial resource strain: Not on file  . Food insecurity    Worry: Not on file    Inability: Not on file  . Transportation needs    Medical: Not on file    Non-medical: Not on file  Tobacco Use  . Smoking status: Never Smoker  . Smokeless tobacco: Never Used  Substance and Sexual Activity  . Alcohol use: No  . Drug use: No  . Sexual activity: Yes    Birth control/protection: Surgical  Lifestyle  . Physical activity    Days per week: Not on file    Minutes per session: Not on file  . Stress: Not on file  Relationships  . Social Herbalist on phone: Not on file    Gets together: Not on file    Attends religious service: Not on file    Active member of club or organization: Not on file    Attends meetings of clubs or organizations: Not on file    Relationship status: Not on file  . Intimate partner violence    Fear of current or ex partner: Not on file    Emotionally abused: Not on file   Physically abused: Not on file    Forced sexual activity: Not on file  Other Topics Concern  . Not on file  Social History Narrative  . Not on file    ROS Review of Systems  Constitutional: Negative.   HENT: Negative.   Eyes:       Glasses  Respiratory: Negative.   Cardiovascular: Negative.   Gastrointestinal:       GERD  Endocrine: Negative.   Genitourinary:       2019-pap smear normal  Musculoskeletal:       Rotator cuff -right-soreness  Skin: Negative.   Allergic/Immunologic: Positive for environmental allergies.  Neurological: Negative.   Psychiatric/Behavioral: Negative.     Objective:   Today's Vitals: BP 117/77 (BP Location: Left Arm, Patient Position: Sitting, Cuff Size: Normal)   Pulse 92   Temp 97.9 F (36.6 C) (Oral)   Ht 5\' 7"  (1.702 m)   Wt 194 lb 3.2 oz (88.1 kg)   SpO2 96%   BMI 30.42 kg/m   Physical Exam  Constitutional: She is oriented to person, place, and time and well-developed, well-nourished, and in no distress. No distress.  HENT:  Head: Normocephalic and atraumatic.  Right Ear: External ear normal.  Left Ear: External ear normal.  Nose: Nose normal.  Mouth/Throat: Oropharynx is clear and moist.  Eyes: Conjunctivae are normal.  Neck: Normal range of motion. Neck supple.  Cardiovascular: Normal rate and regular rhythm.  Pulmonary/Chest: Effort normal and breath sounds normal.  Abdominal: Soft. Bowel sounds are normal.  Neurological: She is alert and oriented to person, place, and time.  Psychiatric: Affect normal.    Assessment & Plan:   1. Gastroesophageal reflux disease without esophagitis Nexium-rx-needs egd-pt will have a colonoscopy and egd in Feb 2021  2. Vitamin deficiency Vit D daily 1000IU Sabrina Mat CarneLEIGH CORUM, MD

## 2019-10-21 ENCOUNTER — Telehealth: Payer: Self-pay | Admitting: Family Medicine

## 2019-10-21 DIAGNOSIS — Z13220 Encounter for screening for lipoid disorders: Secondary | ICD-10-CM

## 2019-10-21 DIAGNOSIS — Z0000 Encounter for general adult medical examination without abnormal findings: Secondary | ICD-10-CM

## 2019-10-21 NOTE — Telephone Encounter (Signed)
Ok pls order. Thx. Dr. Aliscia Clayton 

## 2019-10-21 NOTE — Telephone Encounter (Signed)
Will be a new pt. Last labs in system are from 2019. Tsh, lipid, cmp, cbc

## 2019-10-21 NOTE — Telephone Encounter (Signed)
Pt has cpe on 8/26 and would like lab work done before appt.

## 2019-10-22 NOTE — Telephone Encounter (Signed)
Bw orders put in and pt was notified.  

## 2019-12-04 ENCOUNTER — Encounter: Payer: Self-pay | Admitting: Family Medicine

## 2020-01-09 ENCOUNTER — Ambulatory Visit (INDEPENDENT_AMBULATORY_CARE_PROVIDER_SITE_OTHER): Payer: 59 | Admitting: Family Medicine

## 2020-01-09 ENCOUNTER — Other Ambulatory Visit: Payer: Self-pay

## 2020-01-09 ENCOUNTER — Encounter: Payer: Self-pay | Admitting: Family Medicine

## 2020-01-09 VITALS — BP 110/72 | HR 97 | Temp 97.5°F | Ht 67.0 in | Wt 179.0 lb

## 2020-01-09 DIAGNOSIS — Z0000 Encounter for general adult medical examination without abnormal findings: Secondary | ICD-10-CM | POA: Diagnosis not present

## 2020-01-09 NOTE — Progress Notes (Signed)
Patient ID: Sabrina Wood, female    DOB: 01-Nov-1970, 49 y.o.   MRN: [id number]   Chief Complaint  Patient presents with  . Annual Exam   Subjective:    HPI The patient comes in today for a wellness visit.  Pt stating had covid 12/20 out for 2 wks and not residual issues. Lost father in 7/21. Not on meds and only using vitamins and eating healthy. Pt stating she will call to see Dr. Karilyn Cota, GI.   A review of their health history was completed.  A review of medications was also completed.  Any needed refills; not taking any meds  Eating habits: health conscious  Falls/  MVA accidents in past few months: none  Regular exercise: yes  Specialist pt sees on regular basis: none  Preventative health issues were discussed.   Additional concerns: pt states she already had pelvic exam in January and breast exam in February.  Seeing gyn in January, pt stating pap normal at that time. No records in chart. occ soreness in back and using salonpass and tylenol prn. Pt eating better avoiding red meat.  Medical History Sabrina Wood has a past medical history of Anxiety, Back pain, Chronic knee pain, GERD (gastroesophageal reflux disease), and Seasonal allergies.   Outpatient Encounter Medications as of 01/09/2020  Medication Sig  . Biotin 5000 MCG TABS Take 5,000 mcg by mouth daily.   . Black Elderberry (SAMBUCUS ELDERBERRY PO) Take 1,250 mg by mouth.  . CHASTE TREE PO Take by mouth.  . Cholecalciferol (VITAMIN D3) 125 MCG (5000 UT) CAPS Take by mouth.  . Evening Primrose Oil 1000 MG CAPS Take 1,000 mg by mouth daily.  Marland Kitchen OVER THE COUNTER MEDICATION addeel manuka honey with zinc Turmeric b6 b12  . [DISCONTINUED] esomeprazole (NEXIUM) 40 MG capsule Take 40 mg by mouth 2 (two) times daily.   . [DISCONTINUED] guaiFENesin-codeine 100-10 MG/5ML syrup Take 5 mLs by mouth at bedtime as needed for cough. (Patient not taking: Reported on 07/13/2014)   No facility-administered encounter medications  on file as of 01/09/2020.     Review of Systems  Constitutional: Negative for chills and fever.  HENT: Negative for congestion, rhinorrhea and sore throat.   Respiratory: Negative for cough, shortness of breath and wheezing.   Cardiovascular: Negative for chest pain and leg swelling.  Gastrointestinal: Negative for abdominal pain, diarrhea, nausea and vomiting.  Genitourinary: Negative for dysuria and frequency.  Musculoskeletal: Negative for arthralgias and back pain.  Skin: Negative for rash.  Neurological: Negative for dizziness, weakness and headaches.     Vitals BP 110/72   Pulse 97   Temp (!) 97.5 F (36.4 C)   Ht 5\' 7"  (1.702 m)   Wt 179 lb (81.2 kg)   SpO2 98%   BMI 28.04 kg/m   Objective:   Physical Exam Vitals and nursing note reviewed.  Constitutional:      General: She is not in acute distress.    Appearance: Normal appearance. She is not ill-appearing.  HENT:     Head: Normocephalic and atraumatic.     Right Ear: Tympanic membrane, ear canal and external ear normal.     Left Ear: Tympanic membrane, ear canal and external ear normal.     Nose: Nose normal. No congestion or rhinorrhea.     Mouth/Throat:     Mouth: Mucous membranes are moist.     Pharynx: Oropharynx is clear. No oropharyngeal exudate or posterior oropharyngeal erythema.  Eyes:     Extraocular  Movements: Extraocular movements intact.     Conjunctiva/sclera: Conjunctivae normal.     Pupils: Pupils are equal, round, and reactive to light.  Cardiovascular:     Rate and Rhythm: Normal rate and regular rhythm.     Pulses: Normal pulses.     Heart sounds: Normal heart sounds.  Pulmonary:     Effort: Pulmonary effort is normal. No respiratory distress.     Breath sounds: Normal breath sounds. No wheezing, rhonchi or rales.  Abdominal:     General: Abdomen is flat. Bowel sounds are normal. There is no distension.     Palpations: Abdomen is soft. There is no mass.     Tenderness: There is no  abdominal tenderness. There is no guarding or rebound.     Hernia: No hernia is present.  Musculoskeletal:        General: Normal range of motion.     Cervical back: Normal range of motion.     Right lower leg: No edema.     Left lower leg: No edema.  Skin:    General: Skin is warm and dry.     Findings: No lesion or rash.  Neurological:     General: No focal deficit present.     Mental Status: She is alert and oriented to person, place, and time.     Cranial Nerves: No cranial nerve deficit.  Psychiatric:        Mood and Affect: Mood normal.        Behavior: Behavior normal.        Thought Content: Thought content normal.        Judgment: Judgment normal.      Assessment and Plan   1. Well woman exam (no gynecological exam)   Pt stating seeing gyn for pap, normal for pt. Pt to call for colonoscopy, with Dr. Karilyn Cota. Pt declining referral. Pt stating had mammo in 2/21.  Normal per pt. On chart last mammo 5/20--normal. Pt declining pelvic exam and breast exam.  F/u 1 yr prn.

## 2020-01-16 ENCOUNTER — Ambulatory Visit (INDEPENDENT_AMBULATORY_CARE_PROVIDER_SITE_OTHER): Payer: 59 | Admitting: Family Medicine

## 2020-01-16 ENCOUNTER — Ambulatory Visit (HOSPITAL_COMMUNITY)
Admission: RE | Admit: 2020-01-16 | Discharge: 2020-01-16 | Disposition: A | Discharge status: Home / Self Care | Payer: 59 | Source: Ambulatory Visit | Attending: Family Medicine | Admitting: Family Medicine

## 2020-01-16 ENCOUNTER — Other Ambulatory Visit: Payer: Self-pay

## 2020-01-16 ENCOUNTER — Encounter: Payer: Self-pay | Admitting: Family Medicine

## 2020-01-16 VITALS — BP 130/82 | HR 87 | Temp 96.9°F | Ht 67.0 in | Wt 183.6 lb

## 2020-01-16 DIAGNOSIS — M549 Dorsalgia, unspecified: Secondary | ICD-10-CM | POA: Insufficient documentation

## 2020-01-16 DIAGNOSIS — M25552 Pain in left hip: Secondary | ICD-10-CM | POA: Diagnosis not present

## 2020-01-16 DIAGNOSIS — M4722 Other spondylosis with radiculopathy, cervical region: Secondary | ICD-10-CM | POA: Diagnosis not present

## 2020-01-16 DIAGNOSIS — M545 Low back pain, unspecified: Secondary | ICD-10-CM

## 2020-01-16 DIAGNOSIS — M50123 Cervical disc disorder at C6-C7 level with radiculopathy: Secondary | ICD-10-CM | POA: Diagnosis not present

## 2020-01-16 DIAGNOSIS — M2578 Osteophyte, vertebrae: Secondary | ICD-10-CM | POA: Diagnosis not present

## 2020-01-16 DIAGNOSIS — M50121 Cervical disc disorder at C4-C5 level with radiculopathy: Secondary | ICD-10-CM | POA: Diagnosis not present

## 2020-01-16 DIAGNOSIS — M5125 Other intervertebral disc displacement, thoracolumbar region: Secondary | ICD-10-CM | POA: Diagnosis not present

## 2020-01-16 DIAGNOSIS — M778 Other enthesopathies, not elsewhere classified: Secondary | ICD-10-CM | POA: Diagnosis not present

## 2020-01-16 DIAGNOSIS — M5124 Other intervertebral disc displacement, thoracic region: Secondary | ICD-10-CM | POA: Diagnosis not present

## 2020-01-16 NOTE — Progress Notes (Signed)
Patient ID: Sabrina Wood, female    DOB: [birth year], 49 y.o.   MRN: [id number]   Chief Complaint  Patient presents with  . Back Pain   Subjective:    HPI years of back problems, this problem started one week ago after lifting 30 pounds. Tried yoga, stretching, heat, ice, Tylenol and back brace. 7/10 pain currently. Moving helps, standing still is worse. Extremely uncomfortable today in exam room.  Pt here for back pain-left buttock area going to left leg. Pt was packing last weekend and packed to much. Has tried Aleve, ice, heat , yoga and stretching. Not any better  Medical History [name] has a past medical history of Anxiety, Back pain, Chronic knee pain, GERD (gastroesophageal reflux disease), and Seasonal allergies.   Outpatient Encounter Medications as of 01/16/2020  Medication Sig  . Biotin 5000 MCG TABS Take 5,000 mcg by mouth daily.   . Black Elderberry (SAMBUCUS ELDERBERRY PO) Take 1,250 mg by mouth.  . CHASTE TREE PO Take by mouth.  . Cholecalciferol (VITAMIN D3) 125 MCG (5000 UT) CAPS Take by mouth.  . Evening Primrose Oil 1000 MG CAPS Take 1,000 mg by mouth daily.  Marland Kitchen OVER THE COUNTER MEDICATION addeel manuka honey with zinc Turmeric b6 b12   No facility-administered encounter medications on file as of 01/16/2020.     Review of Systems  Eyes: Negative.   Cardiovascular: Negative.   Gastrointestinal: Negative.   Endocrine: Negative.   Genitourinary: Negative.   Musculoskeletal: Positive for back pain.       Severe x 1 week.   Allergic/Immunologic: Negative.   Neurological: Negative.   Hematological: Negative.   Psychiatric/Behavioral: Negative.      Vitals BP 130/82   Pulse 87   Temp (!) 96.9 F (36.1 C)   Ht 5\' 7"  (1.702 m)   Wt 183 lb 9.6 oz (83.3 kg)   SpO2 98%   BMI 28.76 kg/m   Objective:   Physical Exam Vitals and nursing note reviewed.  Cardiovascular:     Rate and Rhythm: Normal rate and regular rhythm.     Heart sounds: Normal  heart sounds.  Pulmonary:     Effort: Pulmonary effort is normal.     Breath sounds: Normal breath sounds.  Neurological:     Mental Status: She is alert.     Motor: Weakness present.     Coordination: Heel to [facility] Test abnormal.     Gait: Gait abnormal.     Comments: Deficits only on left side. Unable to preform shoulder shrug due to shooting pain to left buttock/leg area. Concerns for herniation. In significant pain today.       Assessment and Plan   1. Back pain, unspecified back location, unspecified back pain laterality, unspecified chronicity - MR CERVICAL SPINE W CONTRAST - MR LUMBAR SPINE W CONTRAST - MR THORACIC SPINE W CONTRAST - DG Lumbar Spine Complete - DG Pelvis 1-2 Views   Will rule out fracture first, then get MRI to rule-out herniation. Reviewed MRI results with Dr. [facility]. 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 It is possible this is causing nerve impingement. Will initiate neurosurgery consult. Dr. Lilyan Punt attempted to call patient with this informaiton. Nursing and/or myself will follow-up on 01/19/20.  Agrees with plan of care discussed today. Understands warning signs to seek further care: weakness.  Understands to follow-up as needed. Will notify patient of Dr. 03/20/20 recommendation 01/19/20 and  nursing will initiate neurosurgery referral.  CHANTEE CERINO, NP 01/16/2020

## 2020-01-18 ENCOUNTER — Encounter: Payer: Self-pay | Admitting: Family Medicine

## 2020-01-20 ENCOUNTER — Encounter: Payer: Self-pay | Admitting: Family Medicine

## 2020-01-20 ENCOUNTER — Other Ambulatory Visit: Payer: Self-pay | Admitting: Family Medicine

## 2020-01-20 DIAGNOSIS — M549 Dorsalgia, unspecified: Secondary | ICD-10-CM

## 2020-01-22 ENCOUNTER — Other Ambulatory Visit: Payer: Self-pay | Admitting: Family Medicine

## 2020-01-22 ENCOUNTER — Telehealth: Payer: Self-pay | Admitting: *Deleted

## 2020-01-22 DIAGNOSIS — M5020 Other cervical disc displacement, unspecified cervical region: Secondary | ICD-10-CM | POA: Insufficient documentation

## 2020-01-22 MED ORDER — PREDNISONE 20 MG PO TABS
ORAL_TABLET | ORAL | 0 refills | Status: DC
Start: 2020-01-22 — End: 2020-02-09

## 2020-01-22 NOTE — Telephone Encounter (Addendum)
Results discussed with patient . Referral to neurosurgery has been placed in Epic(hold on Physical Therapy)- Patient wonders what is recommended till she can get in with neurosurgery  [facility] [place]   Patient's call back number: [phone]

## 2020-01-22 NOTE — Telephone Encounter (Signed)
Pt.notified

## 2020-01-22 NOTE — Telephone Encounter (Signed)
Prednisone taper sent to [facility] [place]. Thanks, Clydie Braun

## 2020-01-30 ENCOUNTER — Other Ambulatory Visit: Payer: Self-pay | Admitting: Family Medicine

## 2020-01-30 ENCOUNTER — Telehealth: Payer: Self-pay | Admitting: Family Medicine

## 2020-01-30 DIAGNOSIS — M5020 Other cervical disc displacement, unspecified cervical region: Secondary | ICD-10-CM

## 2020-01-30 MED ORDER — METHOCARBAMOL 500 MG PO TABS: 500.0000 mg | ORAL_TABLET | Freq: Three times a day (TID) | ORAL | 0 refills | Status: AC | PRN

## 2020-01-30 NOTE — Telephone Encounter (Signed)
Pt wanting to know if she can have Robaxin or Zanflex called into [facility]. Pt states she heard from the orthopedic surgeon and she has an appt with them on 02/10/20. Pt states she is working and finishing up the Prednisone. Please advise. Thank you  Direct Line [phone] Cell (may leave detailed message) [phone]

## 2020-01-30 NOTE — Telephone Encounter (Signed)
Detailed voice message left on voicemail

## 2020-01-30 NOTE — Telephone Encounter (Signed)
Robaxin sent. Thanks, Clydie Braun

## 2020-02-08 ENCOUNTER — Emergency Department (HOSPITAL_COMMUNITY): Payer: 59

## 2020-02-08 ENCOUNTER — Emergency Department (HOSPITAL_COMMUNITY)
Admission: EM | Admit: 2020-02-08 | Discharge: 2020-02-08 | Disposition: A | Discharge status: Home / Self Care | Payer: 59 | Attending: Emergency Medicine | Admitting: Emergency Medicine

## 2020-02-08 ENCOUNTER — Encounter (HOSPITAL_COMMUNITY): Payer: Self-pay | Admitting: Emergency Medicine

## 2020-02-08 ENCOUNTER — Other Ambulatory Visit: Payer: Self-pay

## 2020-02-08 DIAGNOSIS — R1110 Vomiting, unspecified: Secondary | ICD-10-CM | POA: Diagnosis not present

## 2020-02-08 DIAGNOSIS — R519 Headache, unspecified: Secondary | ICD-10-CM | POA: Diagnosis present

## 2020-02-08 DIAGNOSIS — Z20822 Contact with and (suspected) exposure to covid-19: Secondary | ICD-10-CM | POA: Insufficient documentation

## 2020-02-08 DIAGNOSIS — R1031 Right lower quadrant pain: Secondary | ICD-10-CM | POA: Diagnosis not present

## 2020-02-08 DIAGNOSIS — R509 Fever, unspecified: Secondary | ICD-10-CM | POA: Diagnosis not present

## 2020-02-08 DIAGNOSIS — J168 Pneumonia due to other specified infectious organisms: Secondary | ICD-10-CM | POA: Diagnosis not present

## 2020-02-08 DIAGNOSIS — J189 Pneumonia, unspecified organism: Secondary | ICD-10-CM

## 2020-02-08 DIAGNOSIS — R112 Nausea with vomiting, unspecified: Secondary | ICD-10-CM | POA: Insufficient documentation

## 2020-02-08 DIAGNOSIS — E860 Dehydration: Secondary | ICD-10-CM

## 2020-02-08 LAB — COMPREHENSIVE METABOLIC PANEL
ALT: 50 U/L — ABNORMAL HIGH (ref 0–44)
AST: 39 U/L (ref 15–41)
Albumin: 2.8 g/dL — ABNORMAL LOW (ref 3.5–5.0)
Alkaline Phosphatase: 117 U/L (ref 38–126)
Anion gap: 8 (ref 5–15)
BUN: 11 mg/dL (ref 6–20)
CO2: 26 mmol/L (ref 22–32)
Calcium: 8 mg/dL — ABNORMAL LOW (ref 8.9–10.3)
Chloride: 104 mmol/L (ref 98–111)
Creatinine, Ser: 0.53 mg/dL (ref 0.44–1.00)
GFR, Estimated: >60 mL/min (ref >60)
Glucose, Bld: 101 mg/dL — ABNORMAL HIGH (ref 70–99)
Potassium: 4 mmol/L (ref 3.5–5.1)
Sodium: 138 mmol/L (ref 135–145)
Total Bilirubin: 1.2 mg/dL (ref 0.3–1.2)
Total Protein: 6.8 g/dL (ref 6.5–8.1)

## 2020-02-08 LAB — CBC WITH DIFFERENTIAL/PLATELET
Abs Immature Granulocytes: 0.08 10*3/uL — ABNORMAL HIGH (ref 0.00–0.07)
Basophils Absolute: 0 10*3/uL (ref 0.0–0.1)
Basophils Relative: 0 %
Eosinophils Absolute: 0.1 10*3/uL (ref 0.0–0.5)
Eosinophils Relative: 1 %
HCT: 40.6 % (ref 36.0–46.0)
Hemoglobin: 13.4 g/dL (ref 12.0–15.0)
Immature Granulocytes: 1 %
Lymphocytes Relative: 7 %
Lymphs Abs: 1.2 10*3/uL (ref 0.7–4.0)
MCH: 32.2 pg (ref 26.0–34.0)
MCHC: 33 g/dL (ref 30.0–36.0)
MCV: 97.6 fL (ref 80.0–100.0)
Monocytes Absolute: 0.6 10*3/uL (ref 0.1–1.0)
Monocytes Relative: 3 %
Neutro Abs: 15.1 10*3/uL — ABNORMAL HIGH (ref 1.7–7.7)
Neutrophils Relative %: 88 %
Platelets: 425 10*3/uL — ABNORMAL HIGH (ref 150–400)
RBC: 4.16 MIL/uL (ref 3.87–5.11)
RDW: 12.6 % (ref 11.5–15.5)
WBC: 17.1 10*3/uL — ABNORMAL HIGH (ref 4.0–10.5)
nRBC: 0 % (ref 0.0–0.2)

## 2020-02-08 LAB — RESPIRATORY PANEL BY RT PCR (FLU A&B, COVID)
Influenza A by PCR: NEGATIVE
Influenza B by PCR: NEGATIVE
SARS Coronavirus 2 by RT PCR: NEGATIVE

## 2020-02-08 LAB — URINALYSIS, ROUTINE W REFLEX MICROSCOPIC
Bilirubin Urine: NEGATIVE
Glucose, UA: NEGATIVE mg/dL
Ketones, ur: 5 mg/dL — AB
Leukocytes,Ua: NEGATIVE
Nitrite: NEGATIVE
Protein, ur: 30 mg/dL — AB
Specific Gravity, Urine: 1.014 (ref 1.005–1.030)
pH: 6 (ref 5.0–8.0)

## 2020-02-08 LAB — LIPASE, BLOOD: Lipase: 95 U/L — ABNORMAL HIGH (ref 11–51)

## 2020-02-08 LAB — PREGNANCY, URINE: Preg Test, Ur: NEGATIVE

## 2020-02-08 MED ORDER — SODIUM CHLORIDE 0.9 % IV SOLN
500.0000 mg | INTRAVENOUS | Status: DC
  Administered 2020-02-08: 500 mg via INTRAVENOUS
  Filled 2020-02-08: qty 500

## 2020-02-08 MED ORDER — SODIUM CHLORIDE 0.9 % IV SOLN
1.0000 g | Freq: Once | INTRAVENOUS | Status: AC
  Administered 2020-02-08: 1 g via INTRAVENOUS
  Filled 2020-02-08: qty 10

## 2020-02-08 MED ORDER — DOXYCYCLINE HYCLATE 100 MG PO TABS
100.0000 mg | ORAL_TABLET | Freq: Once | ORAL | Status: AC
  Administered 2020-02-08: 100 mg via ORAL
  Filled 2020-02-08: qty 1

## 2020-02-08 MED ORDER — SODIUM CHLORIDE 0.9 % IV BOLUS
1000.0000 mL | Freq: Once | INTRAVENOUS | Status: AC
  Administered 2020-02-08: 1000 mL via INTRAVENOUS

## 2020-02-08 MED ORDER — ONDANSETRON 4 MG PO TBDP: 4.0000 mg | ORAL_TABLET | Freq: Three times a day (TID) | ORAL | 0 refills | Status: AC | PRN

## 2020-02-08 MED ORDER — FENTANYL CITRATE (PF) 100 MCG/2ML IJ SOLN
50.0000 ug | Freq: Once | INTRAMUSCULAR | Status: AC
Start: 2020-02-08 — End: 2020-02-08
  Administered 2020-02-08: 50 ug via INTRAVENOUS
  Filled 2020-02-08: qty 2

## 2020-02-08 MED ORDER — ONDANSETRON HCL 4 MG/2ML IJ SOLN
4.0000 mg | Freq: Once | INTRAMUSCULAR | Status: AC
Start: 2020-02-08 — End: 2020-02-08
  Administered 2020-02-08: 4 mg via INTRAVENOUS
  Filled 2020-02-08: qty 2

## 2020-02-08 MED ORDER — DOXYCYCLINE HYCLATE 100 MG PO CAPS: 100.0000 mg | ORAL_CAPSULE | Freq: Two times a day (BID) | ORAL | 0 refills | Status: DC

## 2020-02-08 MED ORDER — IOHEXOL 300 MG/ML  SOLN
100.0000 mL | Freq: Once | INTRAMUSCULAR | Status: AC | PRN
  Administered 2020-02-08: 100 mL via INTRAVENOUS

## 2020-02-08 NOTE — ED Triage Notes (Signed)
Pt reports headache, n/v since Thursday; fevers up to 103.0; pt reports temp this am 102.0; last took Tylenol x 30 mins ago; decreased appetite; pt reports rebound pain to appendix as well since yesterday

## 2020-02-08 NOTE — ED Provider Notes (Signed)
[facility] EMERGENCY DEPARTMENT Provider Note   CSN: [id number] Arrival date & time: 02/08/20  5400     History Chief Complaint  Patient presents with  . potential COVID    Sabrina Wood is a 49 y.o. female.  Pt presents to the ED today with headache, fever, abdominal pain, and n/v.  She started feeling sick on Thursday, 10/28.  She's had intermittent fevers.  She has been taking tylenol and fever goes away, but comes back.  She has been vomiting until this am.  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  Pt last took tylenol around 0600.        Past Medical History:  Diagnosis Date  . Anxiety   . Back pain   . Chronic knee pain   . GERD (gastroesophageal reflux disease)   . Seasonal allergies     Patient Active Problem List   Diagnosis Date Noted  . Protrusion of cervical intervertebral disc 01/22/2020  . Gastroesophageal reflux disease without esophagitis 12/18/2018  . Vitamin deficiency 12/18/2018    Past Surgical History:  Procedure Laterality Date  . KNEE ARTHROSCOPY    . TUBAL LIGATION       OB History    Gravida  3   Para  1   Term  1   Preterm      AB  2   Living  1     SAB  2   TAB      Ectopic      Multiple      Live Births              Family History  Problem Relation Age of Onset  . Hypertension Other     Social History   Tobacco Use  . Smoking status: Never Smoker  . Smokeless tobacco: Never Used  Vaping Use  . Vaping Use: Never used  Substance Use Topics  . Alcohol use: No  . Drug use: No    Home Medications Prior to Admission medications   Medication Sig Start Date End Date Taking? Authorizing Provider  Biotin 5000 MCG TABS Take 5,000 mcg by mouth daily.     [provider]  Black Elderberry (SAMBUCUS ELDERBERRY PO) Take 1,250 mg by mouth.    [provider]  CHASTE TREE PO Take by mouth.    [provider]    Cholecalciferol (VITAMIN D3) 125 MCG (5000 UT) CAPS Take by mouth.    [provider]  doxycycline (VIBRAMYCIN) 100 MG capsule Take 1 capsule (100 mg total) by mouth 2 (two) times daily. 02/08/20   Jacalyn Lefevre, MD  Evening Primrose Oil 1000 MG CAPS Take 1,000 mg by mouth daily.    [provider]  methocarbamol (ROBAXIN) 500 MG tablet Take 1 tablet (500 mg total) by mouth every 8 (eight) hours as needed for muscle spasms. 01/30/20   Novella Olive, NP  ondansetron (ZOFRAN ODT) 4 MG disintegrating tablet Take 1 tablet (4 mg total) by mouth every 8 (eight) hours as needed for nausea or vomiting. 02/08/20   Jacalyn Lefevre, MD  OVER THE COUNTER MEDICATION addeel manuka honey with zinc Turmeric b6 b12    [provider]  predniSONE (DELTASONE) 20 MG tablet Take 3 tablets for 3 days, then 2 tablets for 3 days, then 1 tablet for 3 days. 01/22/20   Novella Olive, NP    Allergies    Morphine  and related and Zithromax [azithromycin]  Review of Systems   Review of Systems  Constitutional: Positive for fever.  Gastrointestinal: Positive for abdominal pain, nausea and vomiting.  Neurological: Positive for headaches.  All other systems reviewed and are negative.   Physical Exam Updated Vital Signs BP 133/81   Pulse 94   Temp 99.1 F (37.3 C)   Resp 19   Ht 5\' 7"  (1.702 m)   Wt 77.1 kg   SpO2 98%   BMI 26.63 kg/m   Physical Exam Vitals and nursing note reviewed.  Constitutional:      Appearance: Normal appearance.  HENT:     Head: Normocephalic and atraumatic.     Right Ear: External ear normal.     Left Ear: External ear normal.     Nose: Nose normal.     Mouth/Throat:     Mouth: Mucous membranes are dry.  Eyes:     Extraocular Movements: Extraocular movements intact.     Conjunctiva/sclera: Conjunctivae normal.     Pupils: Pupils are equal, round, and reactive to light.  Cardiovascular:     Rate and Rhythm: Tachycardia present.     Pulses:  Normal pulses.     Heart sounds: Normal heart sounds.  Pulmonary:     Effort: Pulmonary effort is normal.     Breath sounds: Normal breath sounds.  Abdominal:     General: Abdomen is flat. Bowel sounds are normal.     Palpations: Abdomen is soft.     Tenderness: There is abdominal tenderness in the right lower quadrant.  Musculoskeletal:        General: Normal range of motion.     Cervical back: Normal range of motion and neck supple.  Skin:    General: Skin is warm.     Capillary Refill: Capillary refill takes less than 2 seconds.  Neurological:     General: No focal deficit present.     Mental Status: She is alert and oriented to person, place, and time.  Psychiatric:        Mood and Affect: Mood normal.        Behavior: Behavior normal.     ED Results / Procedures / Treatments   Labs (all labs ordered are listed, but only abnormal results are displayed) Labs Reviewed  CBC WITH DIFFERENTIAL/PLATELET - Abnormal; Notable for the following components:      Result Value   WBC 17.1 (*)    Platelets 425 (*)    Neutro Abs 15.1 (*)    Abs Immature Granulocytes 0.08 (*)    All other components within normal limits  URINALYSIS, ROUTINE W REFLEX MICROSCOPIC - Abnormal; Notable for the following components:   Color, Urine AMBER (*)    APPearance HAZY (*)    Hgb urine dipstick SMALL (*)    Ketones, ur 5 (*)    Protein, ur 30 (*)    Bacteria, UA MANY (*)    All other components within normal limits  COMPREHENSIVE METABOLIC PANEL - Abnormal; Notable for the following components:   Glucose, Bld 101 (*)    Calcium 8.0 (*)    Albumin 2.8 (*)    ALT 50 (*)    All other components within normal limits  LIPASE, BLOOD - Abnormal; Notable for the following components:   Lipase 95 (*)    All other components within normal limits  RESPIRATORY PANEL BY RT PCR (FLU A&B, COVID)  PREGNANCY, URINE    EKG None  Radiology CT Abdomen  Pelvis W Contrast  Result Date: 02/08/2020 CLINICAL  DATA:  Right lower quadrant pain with nausea, vomiting and fever for 4 days. EXAM: CT ABDOMEN AND PELVIS WITH CONTRAST TECHNIQUE: Multidetector CT imaging of the abdomen and pelvis was performed using the standard protocol following bolus administration of intravenous contrast. CONTRAST:  OMNIPAQUE IOHEXOL 300 MG/ML  SOLN COMPARISON:  03/05/2012 FINDINGS: Lower chest: Heart is normal in size. There are lower lobe, lung base opacities, a significant component of which are ground-glass opacities. This could all be due to atelectasis. Consider infection or inflammation in the proper clinical setting. No pleural effusion. Hepatobiliary: Liver normal in size and overall attenuation. Approximately 3 mm low-attenuation lesion in segment 6, not evident on the prior CT, most likely a cyst. No other liver masses or lesions. Normal gallbladder. No bile duct dilation. Pancreas: Unremarkable. No pancreatic ductal dilatation or surrounding inflammatory changes. Spleen: Normal in size without focal abnormality. Adrenals/Urinary Tract: Adrenal glands are unremarkable. Kidneys are normal, without renal calculi, focal lesion, or hydronephrosis. Bladder is unremarkable. Stomach/Bowel: Stomach is within normal limits. Appendix appears normal. No evidence of bowel wall thickening, distention, or inflammatory changes. Vascular/Lymphatic: No significant vascular findings are present. No enlarged abdominal or pelvic lymph nodes. Reproductive: Normal uterus and adnexa. Bilateral tubal ligation clips,. Stable Other: No abdominal wall hernia or abnormality. No abdominopelvic ascites. Musculoskeletal: No fracture or acute finding. No osteoblastic or osteolytic lesions. IMPRESSION: 1. Bilateral lower lobe, lung base airspace opacities. Multifocal pneumonia, particularly atypical/viral pneumonia, should be considered if there are consistent clinical findings. 2. No acute abnormality within the abdomen or pelvis. No findings to account for  right lower quadrant pain. Normal appendix visualized. Electronically Signed   By: Amie Portland M.D.   On: 02/08/2020 10:52   DG Chest Portable 1 View  Result Date: 02/08/2020 CLINICAL DATA:  Fever. EXAM: PORTABLE CHEST 1 VIEW COMPARISON:  July 13, 2014 FINDINGS: Bibasilar infiltrates are identified. The heart size borderline. The hila and mediastinum are normal. No pneumothorax. No other abnormalities are identified. IMPRESSION: Bibasilar infiltrates are consistent with pneumonia. Recommend short-term follow-up imaging to ensure resolution. Electronically Signed   By: Gerome Sam III M.D   On: 02/08/2020 07:59    Procedures Procedures (including critical care time)  Medications Ordered in ED Medications  azithromycin (ZITHROMAX) 500 mg in sodium chloride 0.9 % 250 mL IVPB (0 mg Intravenous Hold 02/08/20 1035)  doxycycline (VIBRA-TABS) tablet 100 mg (has no administration in time range)  sodium chloride 0.9 % bolus 1,000 mL (0 mLs Intravenous Stopped 02/08/20 1020)  ondansetron (ZOFRAN) injection 4 mg (4 mg Intravenous Given 02/08/20 0815)  fentaNYL (SUBLIMAZE) injection 50 mcg (50 mcg Intravenous Given 02/08/20 0815)  cefTRIAXone (ROCEPHIN) 1 g in sodium chloride 0.9 % 100 mL IVPB (0 g Intravenous Stopped 02/08/20 1020)  sodium chloride 0.9 % bolus 1,000 mL (1,000 mLs Intravenous New Bag/Given 02/08/20 1052)  iohexol (OMNIPAQUE) 300 MG/ML solution 100 mL (100 mLs Intravenous Contrast Given 02/08/20 1042)    ED Course  I have reviewed the triage vital signs and the nursing notes.  Pertinent labs & imaging results that were available during my care of the patient were reviewed by me and considered in my medical decision making (see chart for details).    MDM Rules/Calculators/A&P                          Covid negative.  Pt feels much better after treatment.  Pt did  feel nauseous and develop a h/a when she received zithromax.  This was stopped and she felt better.  CT abd/pelvis  shows nothing intraabdominal.  Abd pain likely from pneumonia.  She is oxygenating well.  She is stable for d/c.  Return if worse.  Sabrina Wood was evaluated in Emergency Department on 02/08/2020 for the symptoms described in the history of present illness. She was evaluated in the context of the global COVID-19 pandemic, which necessitated consideration that the patient might be at risk for infection with the SARS-CoV-2 virus that causes COVID-19. Institutional protocols and algorithms that pertain to the evaluation of patients at risk for COVID-19 are in a state of rapid change based on information released by regulatory bodies including the CDC and federal and state organizations. These policies and algorithms were followed during the patient's care in the ED. Final Clinical Impression(s) / ED Diagnoses Final diagnoses:  Pneumonia of both lower lobes due to infectious organism  Dehydration    Rx / DC Orders ED Discharge Orders         Ordered    doxycycline (VIBRAMYCIN) 100 MG capsule  2 times daily        02/08/20 1104    ondansetron (ZOFRAN ODT) 4 MG disintegrating tablet  Every 8 hours PRN        02/08/20 1104           Jacalyn LefevreHaviland, Zyria Fiscus, MD 02/08/20 1106

## 2020-02-08 NOTE — ED Notes (Signed)
Pt inquired about medication of headache. Consulted PA and reported pt could take otc medication and would need to be re-seen if wanted medication for headache. Pt aware and verbalized understanding.

## 2020-02-08 NOTE — ED Notes (Signed)
Went to check on pt and pt looked distressed. Pt complaining of nausea and headache after approx 20 mins after azithromycin hung. Infusion has been stopped and Dr Particia Nearing was notified. Medication is now held. Vitals WDL.Marland Kitchentemp 99.1

## 2020-02-09 ENCOUNTER — Telehealth (INDEPENDENT_AMBULATORY_CARE_PROVIDER_SITE_OTHER): Payer: 59 | Admitting: Family Medicine

## 2020-02-09 ENCOUNTER — Encounter: Payer: Self-pay | Admitting: Family Medicine

## 2020-02-09 DIAGNOSIS — R112 Nausea with vomiting, unspecified: Secondary | ICD-10-CM | POA: Diagnosis not present

## 2020-02-09 DIAGNOSIS — J189 Pneumonia, unspecified organism: Secondary | ICD-10-CM

## 2020-02-09 MED ORDER — LEVOFLOXACIN 750 MG PO TABS: 750.0000 mg | ORAL_TABLET | Freq: Every day | ORAL | 0 refills | Status: DC

## 2020-02-09 MED ORDER — SUMATRIPTAN SUCCINATE 50 MG PO TABS: 50.0000 mg | ORAL_TABLET | ORAL | 0 refills | Status: AC | PRN

## 2020-02-09 MED ORDER — PROMETHAZINE HCL 25 MG PO TABS: 25.0000 mg | ORAL_TABLET | Freq: Three times a day (TID) | ORAL | 0 refills | Status: DC | PRN

## 2020-02-09 NOTE — Progress Notes (Signed)
Patient ID: Sabrina Wood, female    DOB: 08/13/1970, 49 y.o.   MRN: [id number]  Virtual Visit via Video Note  I connected with Sabrina Wood on 02/09/20 at  3:50 PM EDT by a video enabled telemedicine application and verified that I am speaking with the correct person using two identifiers.  Location: Patient: home Provider: office   I discussed the limitations of evaluation and management by telemedicine and the availability of in person appointments. The patient expressed understanding and agreed to proceed.   Chief Complaint  Patient presents with  . Hospitalization Follow-up    Patient is following up after ER visit over the weekend. Positive for pneumonia. Still struggling with headaches and fever on and off. Taking tylenol, doxycycline and zofran Prn.    Subjective:    HPI Pt had phone visit to discuss ER follow up.  Pt dx with bilateral pneumonia. Pt feeling sick with fever and headache for 4 days.  Never had sob, or coughing. Has bad headache unable to get relief.  Fever is off and on.  But when trying to take her abx or doxycycline vomiting it up.  Had an old tablet of imitrex from when she used to have migraines, and helped slightly.  Didn't take the zofran today. Has kept down soup, gatorade, water, and other fluids today.  Fever got back up to 103.1 today and got in shower then 30 mins later recheck temp down to 100.40F.   Had xray chest in ER and was showing bilateral pneumonia.  Wbc 17.1 on labs.  Given several liters of fluids and was feeling better when leaving the ER. Now this afternoon things worse with fever back and vomiting up pills.  CT scan abd-  IMPRESSION: 1. Bilateral lower lobe, lung base airspace opacities. Multifocal pneumonia, particularly atypical/viral pneumonia, should be considered if there are consistent clinical findings. 2. No acute abnormality within the abdomen or pelvis. No findings to account for right lower quadrant pain. Normal  appendix visualized.  CXR- showing bibasilar infiltrates consistent with pneumonia.  Medical History Sabrina Wood has a past medical history of Anxiety, Back pain, Chronic knee pain, GERD (gastroesophageal reflux disease), and Seasonal allergies.   Outpatient Encounter Medications as of 02/09/2020  Medication Sig  . Biotin 5000 MCG TABS Take 5,000 mcg by mouth daily.  (Patient not taking: Reported on 02/09/2020)  . Black Elderberry (SAMBUCUS ELDERBERRY PO) Take 1,250 mg by mouth. (Patient not taking: Reported on 02/09/2020)  . CHASTE TREE PO Take by mouth. (Patient not taking: Reported on 02/09/2020)  . Cholecalciferol (VITAMIN D3) 125 MCG (5000 UT) CAPS Take by mouth. (Patient not taking: Reported on 02/09/2020)  . doxycycline (VIBRAMYCIN) 100 MG capsule Take 1 capsule (100 mg total) by mouth 2 (two) times daily.  . Evening Primrose Oil 1000 MG CAPS Take 1,000 mg by mouth daily. (Patient not taking: Reported on 02/09/2020)  . levofloxacin (LEVAQUIN) 750 MG tablet Take 1 tablet (750 mg total) by mouth daily.  . methocarbamol (ROBAXIN) 500 MG tablet Take 1 tablet (500 mg total) by mouth every 8 (eight) hours as needed for muscle spasms.  . ondansetron (ZOFRAN ODT) 4 MG disintegrating tablet Take 1 tablet (4 mg total) by mouth every 8 (eight) hours as needed for nausea or vomiting.  Marland Kitchen OVER THE COUNTER MEDICATION addeel manuka honey with zinc Turmeric b6 b12 (Patient not taking: Reported on 02/09/2020)  . promethazine (PHENERGAN) 25 MG tablet Take 1 tablet (25 mg total) by mouth every 8 (  eight) hours as needed for nausea or vomiting.  . SUMAtriptan (IMITREX) 50 MG tablet Take 1 tablet (50 mg total) by mouth every 2 (two) hours as needed for migraine. May repeat in 2 hours if headache persists or recurs.  . [DISCONTINUED] predniSONE (DELTASONE) 20 MG tablet Take 3 tablets for 3 days, then 2 tablets for 3 days, then 1 tablet for 3 days.   No facility-administered encounter medications on file as of  02/09/2020.     Review of Systems  Constitutional: Positive for fever. Negative for chills.  HENT: Negative for congestion, rhinorrhea and sore throat.   Respiratory: Negative for cough, shortness of breath and wheezing.   Cardiovascular: Negative for chest pain and leg swelling.  Gastrointestinal: Positive for nausea and vomiting. Negative for abdominal pain and diarrhea.  Genitourinary: Negative for dysuria and frequency.  Musculoskeletal: Negative for arthralgias and back pain.  Skin: Negative for rash.  Neurological: Positive for headaches. Negative for dizziness and weakness.     Vitals There were no vitals taken for this visit.  Objective:   Physical Exam  No PE due to phone visit.  Assessment and Plan   1. Pneumonia of both lower lobes due to infectious organism  2. Non-intractable vomiting with nausea, unspecified vomiting type   Changed abx from doxycyclin to levaquin for 7 days.  Pt not tolerating doxy and vomiting up the tablet.  Gave phenergan to help with migraine and vomiting/nausea. Then take imitrex as needed for migraine.  Tylenol prn fever. Cont to slowly hydrate with fluids.  Pt to call back with update in 2 days ot let us know how she's feeling. If worsening to call sooner with worse fever, vomiting, decrease in urination, not able to keep meds down.d Reviewed ER chart, xray, ct scan, and labs. Need repeat xray in 3 months for resolution.  Pt in agreement.  F/u in office for recheck in 7-10 days.  Follow Up Instructions:    I discussed the assessment and treatment plan with the patient. The patient was provided an opportunity to ask questions and all were answered. The patient agreed with the plan and demonstrated an understanding of the instructions.   The patient was advised to call back or seek an in-person evaluation if the symptoms worsen or if the condition fails to improve as anticipated.  I provided 15 minutes of non-face-to-face time  during this encounter.

## 2020-02-18 ENCOUNTER — Other Ambulatory Visit: Payer: Self-pay

## 2020-02-18 ENCOUNTER — Ambulatory Visit (INDEPENDENT_AMBULATORY_CARE_PROVIDER_SITE_OTHER): Payer: 59 | Admitting: Family Medicine

## 2020-02-18 ENCOUNTER — Encounter: Payer: Self-pay | Admitting: Family Medicine

## 2020-02-18 VITALS — BP 126/82 | HR 82 | Temp 97.8°F | Ht 67.0 in | Wt 176.6 lb

## 2020-02-18 DIAGNOSIS — J189 Pneumonia, unspecified organism: Secondary | ICD-10-CM

## 2020-02-18 NOTE — Progress Notes (Signed)
Patient ID: Sabrina Wood, female    DOB: 02-13-1971, 49 y.o.   MRN: [id number]   Chief Complaint  Patient presents with  . pneumonia follow up    doing better   Subjective:    HPI  F/u bibasilar pneumonia.  Pt seen in the ER on 02/08/20 and dx with bilateral pneumonia. Was having sob and coughing.  Had chest xray and showing bilateral lower lobe pneumonia.  Pt reported wearing a tight fitting back brace that week and thinking this might have limited her breathing and getting in a deep breath and ended up getting a pneumonia.  Fever broke Friday, Monday feeling better.  Pt was given doxycyclin and was having some vomiting.  I called in levaquin and this worked better.  Walking 3 miles now and getting stamina and feeling good. No coughing and not to over exert herself.  Did well on levaquin and able to finish the medication. Was smoker in past.  Had back brace from herniated disc that she was wearing for a couple weeks before getting sick.    Saw Dr. Dutch Quint and didn't want to do surgery. 2 wks was in 01/13/20, had back brace.  Pt hasn't put the back brace back on since.     Medical History Jene has a past medical history of Anxiety, Back pain, Chronic knee pain, GERD (gastroesophageal reflux disease), and Seasonal allergies.   Outpatient Encounter Medications as of 02/18/2020  Medication Sig  . Biotin 5000 MCG TABS Take 5,000 mcg by mouth daily.  (Patient not taking: Reported on 02/09/2020)  . Black Elderberry (SAMBUCUS ELDERBERRY PO) Take 1,250 mg by mouth. (Patient not taking: Reported on 02/09/2020)  . CHASTE TREE PO Take by mouth. (Patient not taking: Reported on 02/09/2020)  . Cholecalciferol (VITAMIN D3) 125 MCG (5000 UT) CAPS Take by mouth. (Patient not taking: Reported on 02/09/2020)  . doxycycline (VIBRAMYCIN) 100 MG capsule Take 1 capsule (100 mg total) by mouth 2 (two) times daily. (Patient not taking: Reported on 02/18/2020)  . Evening Primrose Oil 1000 MG CAPS Take  1,000 mg by mouth daily. (Patient not taking: Reported on 02/09/2020)  . levofloxacin (LEVAQUIN) 750 MG tablet Take 1 tablet (750 mg total) by mouth daily. (Patient not taking: Reported on 02/18/2020)  . methocarbamol (ROBAXIN) 500 MG tablet Take 1 tablet (500 mg total) by mouth every 8 (eight) hours as needed for muscle spasms.  . ondansetron (ZOFRAN ODT) 4 MG disintegrating tablet Take 1 tablet (4 mg total) by mouth every 8 (eight) hours as needed for nausea or vomiting.  Marland Kitchen OVER THE COUNTER MEDICATION addeel manuka honey with zinc Turmeric b6 b12 (Patient not taking: Reported on 02/09/2020)  . promethazine (PHENERGAN) 25 MG tablet Take 1 tablet (25 mg total) by mouth every 8 (eight) hours as needed for nausea or vomiting. (Patient not taking: Reported on 02/18/2020)  . SUMAtriptan (IMITREX) 50 MG tablet Take 1 tablet (50 mg total) by mouth every 2 (two) hours as needed for migraine. May repeat in 2 hours if headache persists or recurs.   No facility-administered encounter medications on file as of 02/18/2020.     Review of Systems  Constitutional: Negative for chills and fever.  HENT: Negative for congestion, rhinorrhea and sore throat.   Respiratory: Negative for cough, shortness of breath and wheezing.   Cardiovascular: Negative for chest pain and leg swelling.  Gastrointestinal: Negative for abdominal pain, diarrhea, nausea and vomiting.  Genitourinary: Negative for dysuria and frequency.  Musculoskeletal: Negative for  arthralgias and back pain.  Skin: Negative for rash.  Neurological: Negative for dizziness, weakness and headaches.     Vitals BP 126/82   Pulse 82   Temp 97.8 F (36.6 C) (Oral)   Ht 5\' 7"  (1.702 m)   Wt 176 lb 9.6 oz (80.1 kg)   SpO2 100%   BMI 27.66 kg/m   Objective:   Physical Exam Vitals and nursing note reviewed.  Constitutional:      Appearance: Normal appearance.  HENT:     Head: Normocephalic and atraumatic.     Nose: Nose normal.      Mouth/Throat:     Mouth: Mucous membranes are moist.     Pharynx: Oropharynx is clear.  Eyes:     Extraocular Movements: Extraocular movements intact.     Conjunctiva/sclera: Conjunctivae normal.     Pupils: Pupils are equal, round, and reactive to light.  Cardiovascular:     Rate and Rhythm: Normal rate and regular rhythm.     Pulses: Normal pulses.     Heart sounds: Normal heart sounds. No murmur heard.   Pulmonary:     Effort: Pulmonary effort is normal. No respiratory distress.     Breath sounds: Normal breath sounds. No wheezing, rhonchi or rales.  Musculoskeletal:        General: Normal range of motion.     Right lower leg: No edema.     Left lower leg: No edema.  Skin:    General: Skin is warm and dry.     Findings: No lesion or rash.  Neurological:     General: No focal deficit present.     Mental Status: She is alert and oriented to person, place, and time.  Psychiatric:        Mood and Affect: Mood normal.        Behavior: Behavior normal.      Assessment and Plan   1. Pneumonia of both lower lobes due to infectious organism - DG Chest 2 View; Future   Pneumonia-improved and breathing better. No longer wearing the back brace. Finished levaquin. Ordered repeat xray to assess resolution of pneumonia in 2-3 months.  Pt to call or rto if worsening symptoms.  F/u prn.

## 2020-04-19 ENCOUNTER — Ambulatory Visit: Payer: 59 | Admitting: Family Medicine

## 2020-05-03 ENCOUNTER — Ambulatory Visit: Payer: 59 | Admitting: Family Medicine

## 2020-05-03 ENCOUNTER — Encounter: Payer: Self-pay | Admitting: Family Medicine

## 2020-05-03 ENCOUNTER — Other Ambulatory Visit: Payer: Self-pay

## 2020-05-03 ENCOUNTER — Ambulatory Visit (HOSPITAL_COMMUNITY)
Admission: RE | Admit: 2020-05-03 | Discharge: 2020-05-03 | Disposition: A | Discharge status: Home / Self Care | Payer: 59 | Source: Ambulatory Visit | Attending: Family Medicine | Admitting: Family Medicine

## 2020-05-03 VITALS — BP 122/86 | HR 103 | Temp 97.2°F | Wt 186.6 lb

## 2020-05-03 DIAGNOSIS — J189 Pneumonia, unspecified organism: Secondary | ICD-10-CM | POA: Insufficient documentation

## 2020-05-03 DIAGNOSIS — Z8701 Personal history of pneumonia (recurrent): Secondary | ICD-10-CM | POA: Diagnosis not present

## 2020-05-03 NOTE — Progress Notes (Signed)
Patient ID: Sabrina Wood, female    DOB: February 05, 1971, 50 y.o.   MRN: [id number]   Chief Complaint  Patient presents with  . Follow-up    Patient coming in to follow up on pneumonia.  Patient has been doing well with no concerns. Just had chest xray done.    Subjective:    HPI Pt doing well after recovery of pneumonia.  Was given 7 days of Levaquin. Just got a new puppy.  9 wks old.  Has been enjoy playing and walking the dog.   Doing well- no coughing, or sob. Had chest xray completed today as a follow up for resolution of pneumonia 2 months ago.  We believe she got the bilateral pneumonia due to wearing a tight back brace for back pain, then developed sob and pneumonia.   Medical History Sabrina Wood has a past medical history of Anxiety, Back pain, Chronic knee pain, GERD (gastroesophageal reflux disease), and Seasonal allergies.   Outpatient Encounter Medications as of 05/03/2020  Medication Sig  . Biotin 5000 MCG TABS Take 5,000 mcg by mouth daily.  . Black Elderberry (SAMBUCUS ELDERBERRY PO) Take 1,250 mg by mouth.  . CHASTE TREE PO Take by mouth.  . Cholecalciferol (VITAMIN D3) 125 MCG (5000 UT) CAPS Take by mouth.  . Evening Primrose Oil 1000 MG CAPS Take 1,000 mg by mouth daily.  . methocarbamol (ROBAXIN) 500 MG tablet Take 1 tablet (500 mg total) by mouth every 8 (eight) hours as needed for muscle spasms.  . ondansetron (ZOFRAN ODT) 4 MG disintegrating tablet Take 1 tablet (4 mg total) by mouth every 8 (eight) hours as needed for nausea or vomiting.  Marland Kitchen OVER THE COUNTER MEDICATION addeel manuka honey with zinc Turmeric b6 b12 (Patient not taking: Reported on 02/09/2020)  . SUMAtriptan (IMITREX) 50 MG tablet Take 1 tablet (50 mg total) by mouth every 2 (two) hours as needed for migraine. May repeat in 2 hours if headache persists or recurs.  . [DISCONTINUED] doxycycline (VIBRAMYCIN) 100 MG capsule Take 1 capsule (100 mg total) by mouth 2 (two) times daily. (Patient not  taking: Reported on 02/18/2020)  . [DISCONTINUED] levofloxacin (LEVAQUIN) 750 MG tablet Take 1 tablet (750 mg total) by mouth daily. (Patient not taking: Reported on 02/18/2020)  . [DISCONTINUED] promethazine (PHENERGAN) 25 MG tablet Take 1 tablet (25 mg total) by mouth every 8 (eight) hours as needed for nausea or vomiting. (Patient not taking: Reported on 02/18/2020)   No facility-administered encounter medications on file as of 05/03/2020.     Review of Systems  Constitutional: Negative for chills and fever.  HENT: Negative for congestion, rhinorrhea and sore throat.   Respiratory: Negative for cough, shortness of breath and wheezing.   Cardiovascular: Negative for chest pain and leg swelling.  Gastrointestinal: Negative for abdominal pain, diarrhea, nausea and vomiting.  Genitourinary: Negative for dysuria and frequency.  Musculoskeletal: Negative for arthralgias and back pain.  Skin: Negative for rash.  Neurological: Negative for dizziness, weakness and headaches.      Vitals BP 122/86   Pulse (!) 103   Temp (!) 97.2 F (36.2 C)   Wt 186 lb 9.6 oz (84.6 kg)   SpO2 98%   BMI 29.23 kg/m   Objective:   Physical Exam Vitals and nursing note reviewed.  Constitutional:      General: She is not in acute distress.    Appearance: Normal appearance. She is not ill-appearing.  HENT:     Head: Normocephalic and atraumatic.  Cardiovascular:     Rate and Rhythm: Regular rhythm. Tachycardia present.     Pulses: Normal pulses.     Heart sounds: Normal heart sounds. No murmur heard.   Pulmonary:     Effort: Pulmonary effort is normal. No respiratory distress.     Breath sounds: Normal breath sounds. No stridor. No wheezing, rhonchi or rales.  Musculoskeletal:        General: Normal range of motion.     Right lower leg: No edema.     Left lower leg: No edema.  Skin:    General: Skin is warm and dry.     Findings: No lesion or rash.  Neurological:     General: No focal  deficit present.     Mental Status: She is alert and oriented to person, place, and time.     Cranial Nerves: No cranial nerve deficit.  Psychiatric:        Mood and Affect: Mood normal.        Behavior: Behavior normal.        Thought Content: Thought content normal.        Judgment: Judgment normal.      Assessment and Plan   1. History of recent pneumonia   Pneumonia- resolved.  repeat xray chest completed today, completed. And all opacities have resolved.    F/u prn.

## 2021-11-17 IMAGING — MR MR CERVICAL SPINE W/O CM
5 series · 37 of 48 positions shown · non-contrast
Comparison: None.

CLINICAL DATA: Severe low back and left buttock pain since a
lifting injury 01/09/2020

EXAM:
MRI CERVICAL SPINE WITHOUT CONTRAST
TECHNIQUE: Multiplanar, multisequence MR imaging of the cervical spine was
performed. No intravenous contrast was administered.

[Series 16: T2 · sagittal · 3.0mm · 0.69mm/px · 6 of 15 slices shown (1 of 2)]
[im 1/15]
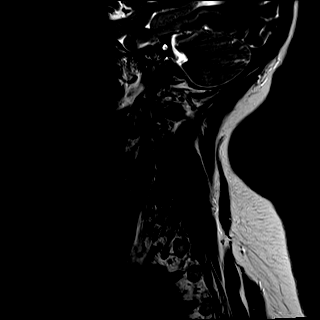
[im 3/15]
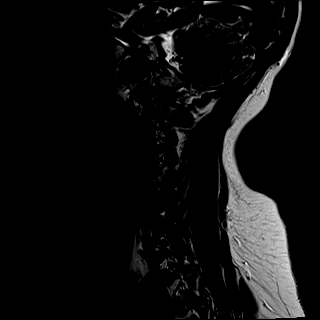
[im 6/15]
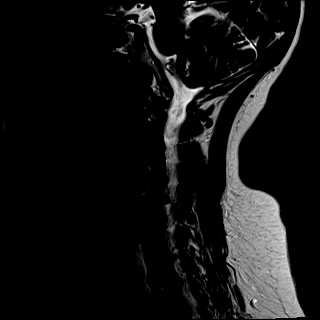
[im 9/15]
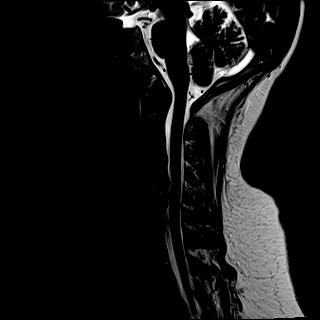
[im 12/15]
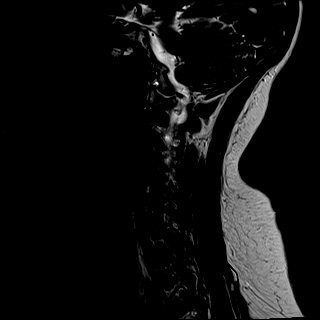
[im 15/15]
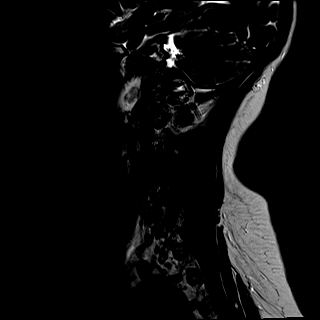

[Series 17: T1 · sagittal · 3.0mm · 0.69mm/px · 6 of 15 slices shown]
[im 1/15]
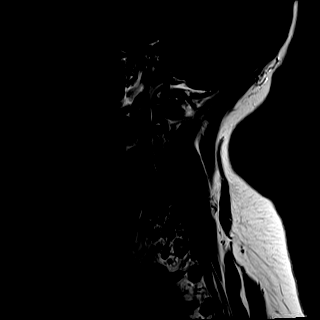
[im 3/15]
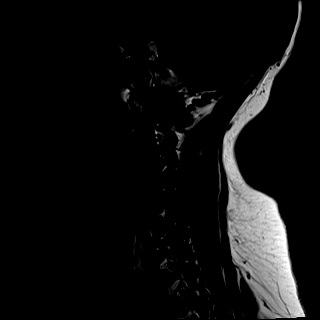
[im 6/15]
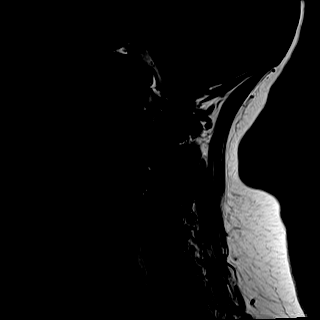
[im 9/15]
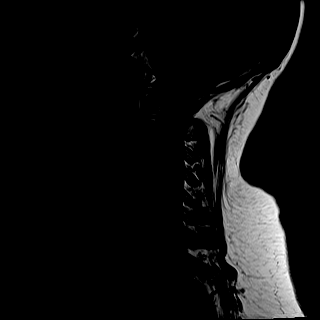
[im 12/15]
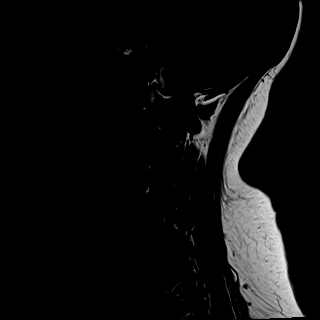
[im 15/15]
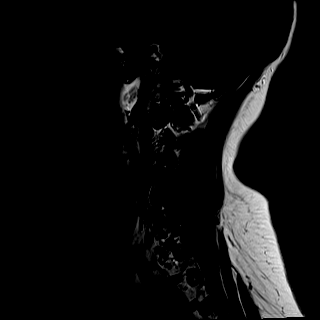

[Series 18: STIR · sagittal · 3.0mm · 0.86mm/px · 6 of 15 slices shown]
[im 1/15]
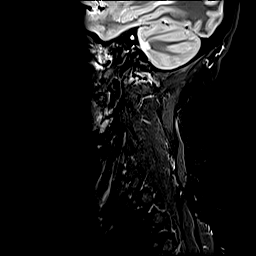
[im 3/15]
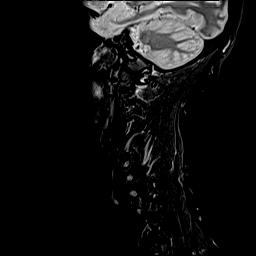
[im 6/15]
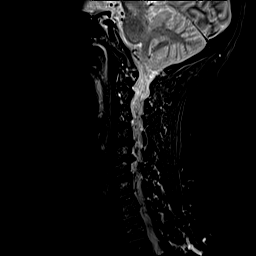
[im 9/15]
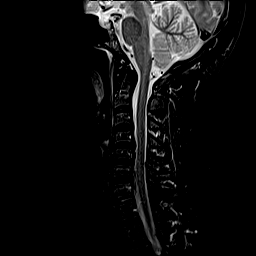
[im 12/15]
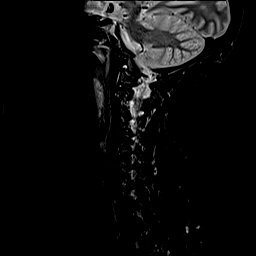
[im 15/15]
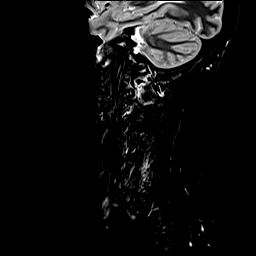

[Series 19: T2 · axial · 3.0mm · 0.66mm/px · z∈[-50,+60]mm · 11 of 38 slices shown (2 of 2)]
[im 1/38]
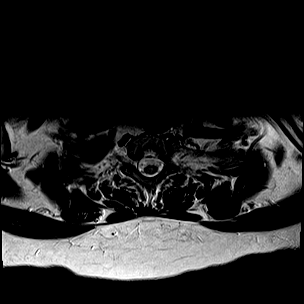
[im 3/38]
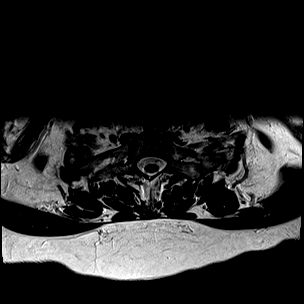
[im 6/38]
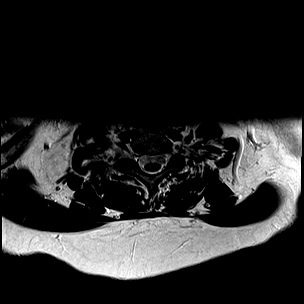
[im 8/38]
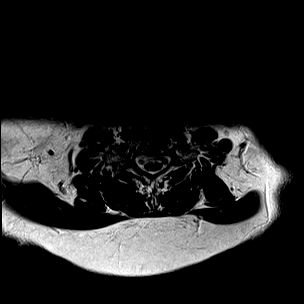
[im 11/38]
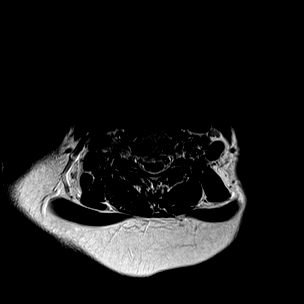
[im 16/38]
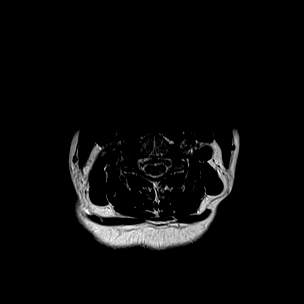
[im 19/38]
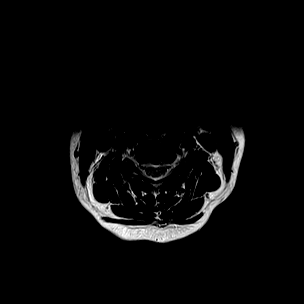
[im 22/38]
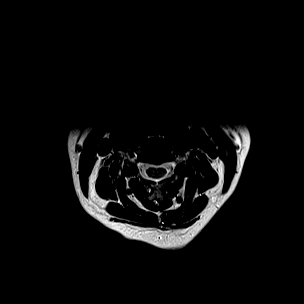
[im 27/38]
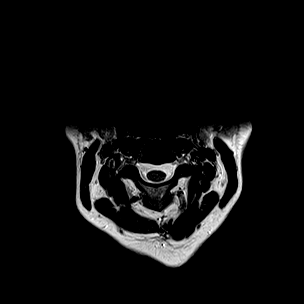
[im 32/38]
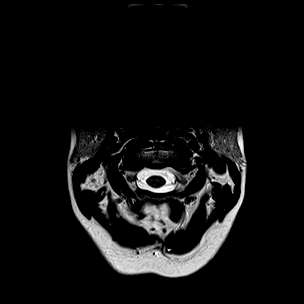
[im 38/38]
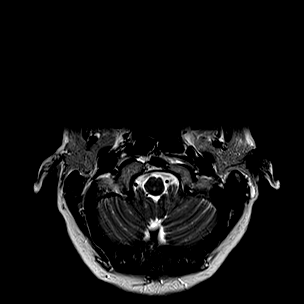

[Series 20: GRE · axial · 3.0mm · 0.35mm/px · z∈[-45,+65]mm · 8 of 38 slices shown]
[im 1/38]
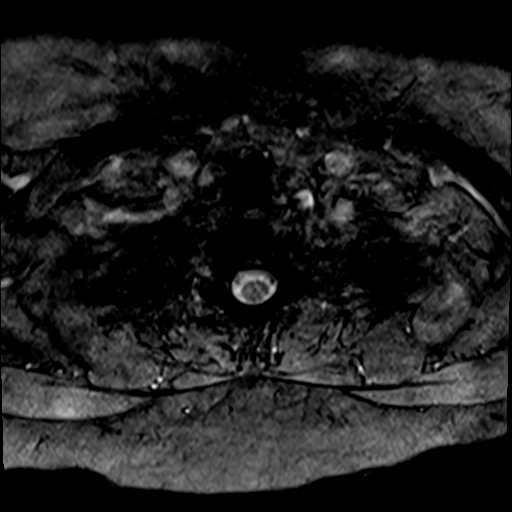
[im 6/38]
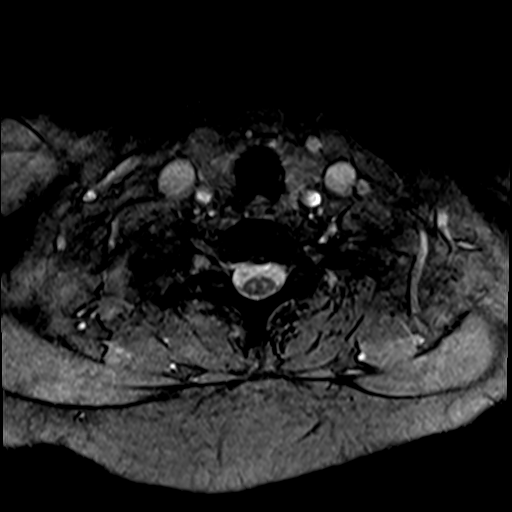
[im 11/38]
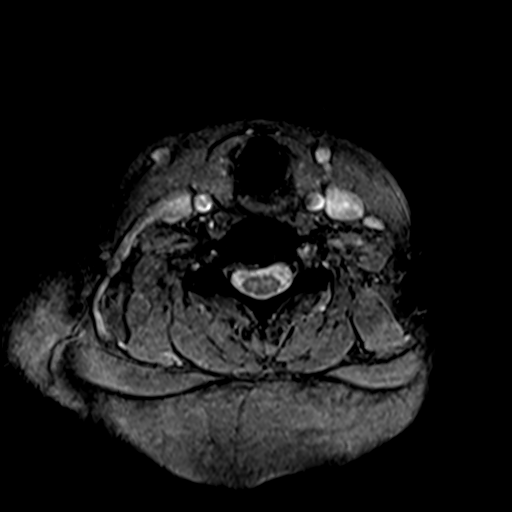
[im 16/38]
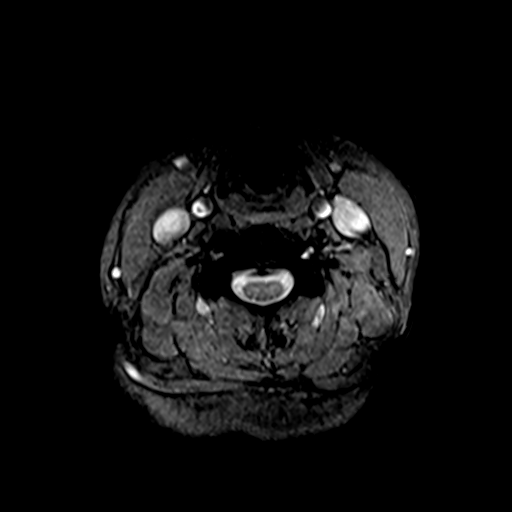
[im 22/38]
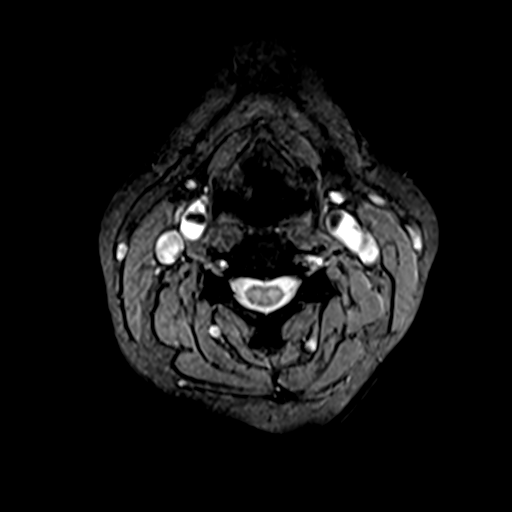
[im 27/38]
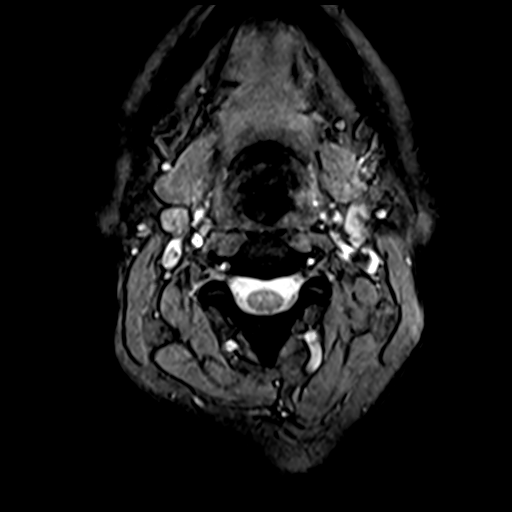
[im 32/38]
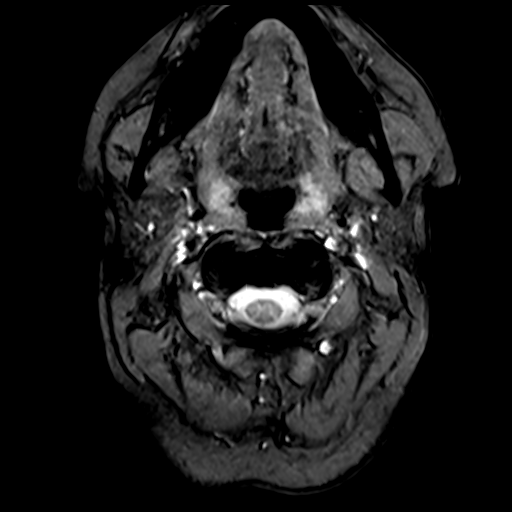
[im 38/38]
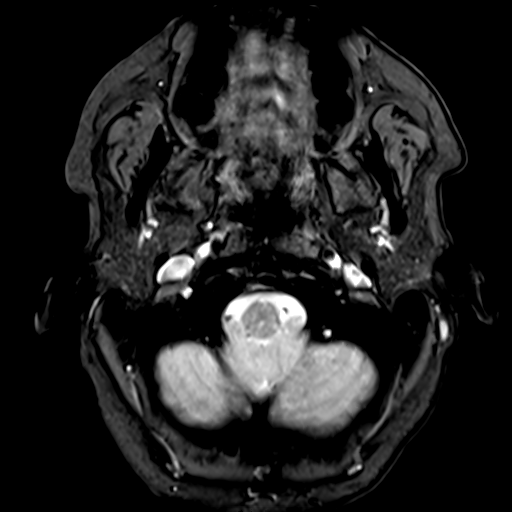

[37 of 48 positions shown; findings below may reference images not displayed]

FINDINGS: Alignment: Normal

Vertebrae: Normal marrow signal.  No bone lesions or fractures.

Cord: Normal cord signal intensity.  No cord lesions or syrinx.

Posterior Fossa, vertebral arteries, paraspinal tissues: No
significant findings. Mild degenerative changes at C1-2 with early
pannus formation but no mass effect on the upper cervical cord.

Disc levels:

C2-3: No significant findings.

C3-4: No significant findings.

C4-5: Broad-based disc protrusion with flattening of the ventral
thecal sac and mild narrowing the ventral CSF space. Mild foraminal
encroachment on the left. No significant foraminal stenosis.

C5-6: Bulging annulus and osteophytic ridging with flattening of the
ventral thecal sac and mild narrowing the ventral CSF space.
Bilateral disc osteophyte complexes with mild foraminal encroachment
bilaterally.

C6-7: Shallow broad-based right paracentral and foraminal disc
protrusion with mild mass effect on the right side of thecal sac.
Potential irritation of the right C7 nerve root.

C7-T1: No significant findings.
IMPRESSION: 1. Broad-based disc protrusion at C4-5 with flattening of the
ventral thecal sac and mild left foraminal encroachment.
2. Bulging annulus and osteophytic ridging at C5-6 with flattening
of the ventral thecal sac and mild narrowing the ventral CSF space.
Bilateral disc osteophyte complexes with mild foraminal encroachment
bilaterally.
3. Shallow broad-based right paracentral and foraminal disc
protrusion at C6-7 with mild mass effect on the right side of the
thecal sac and possible irritation of the right C7 nerve root.

## 2021-12-10 IMAGING — DX DG CHEST 1V PORT
1 series · 1 of 1 positions shown · non-contrast
Comparison: July 13, 2014

CLINICAL DATA: Fever.

EXAM:
PORTABLE CHEST 1 VIEW

[chest ap]
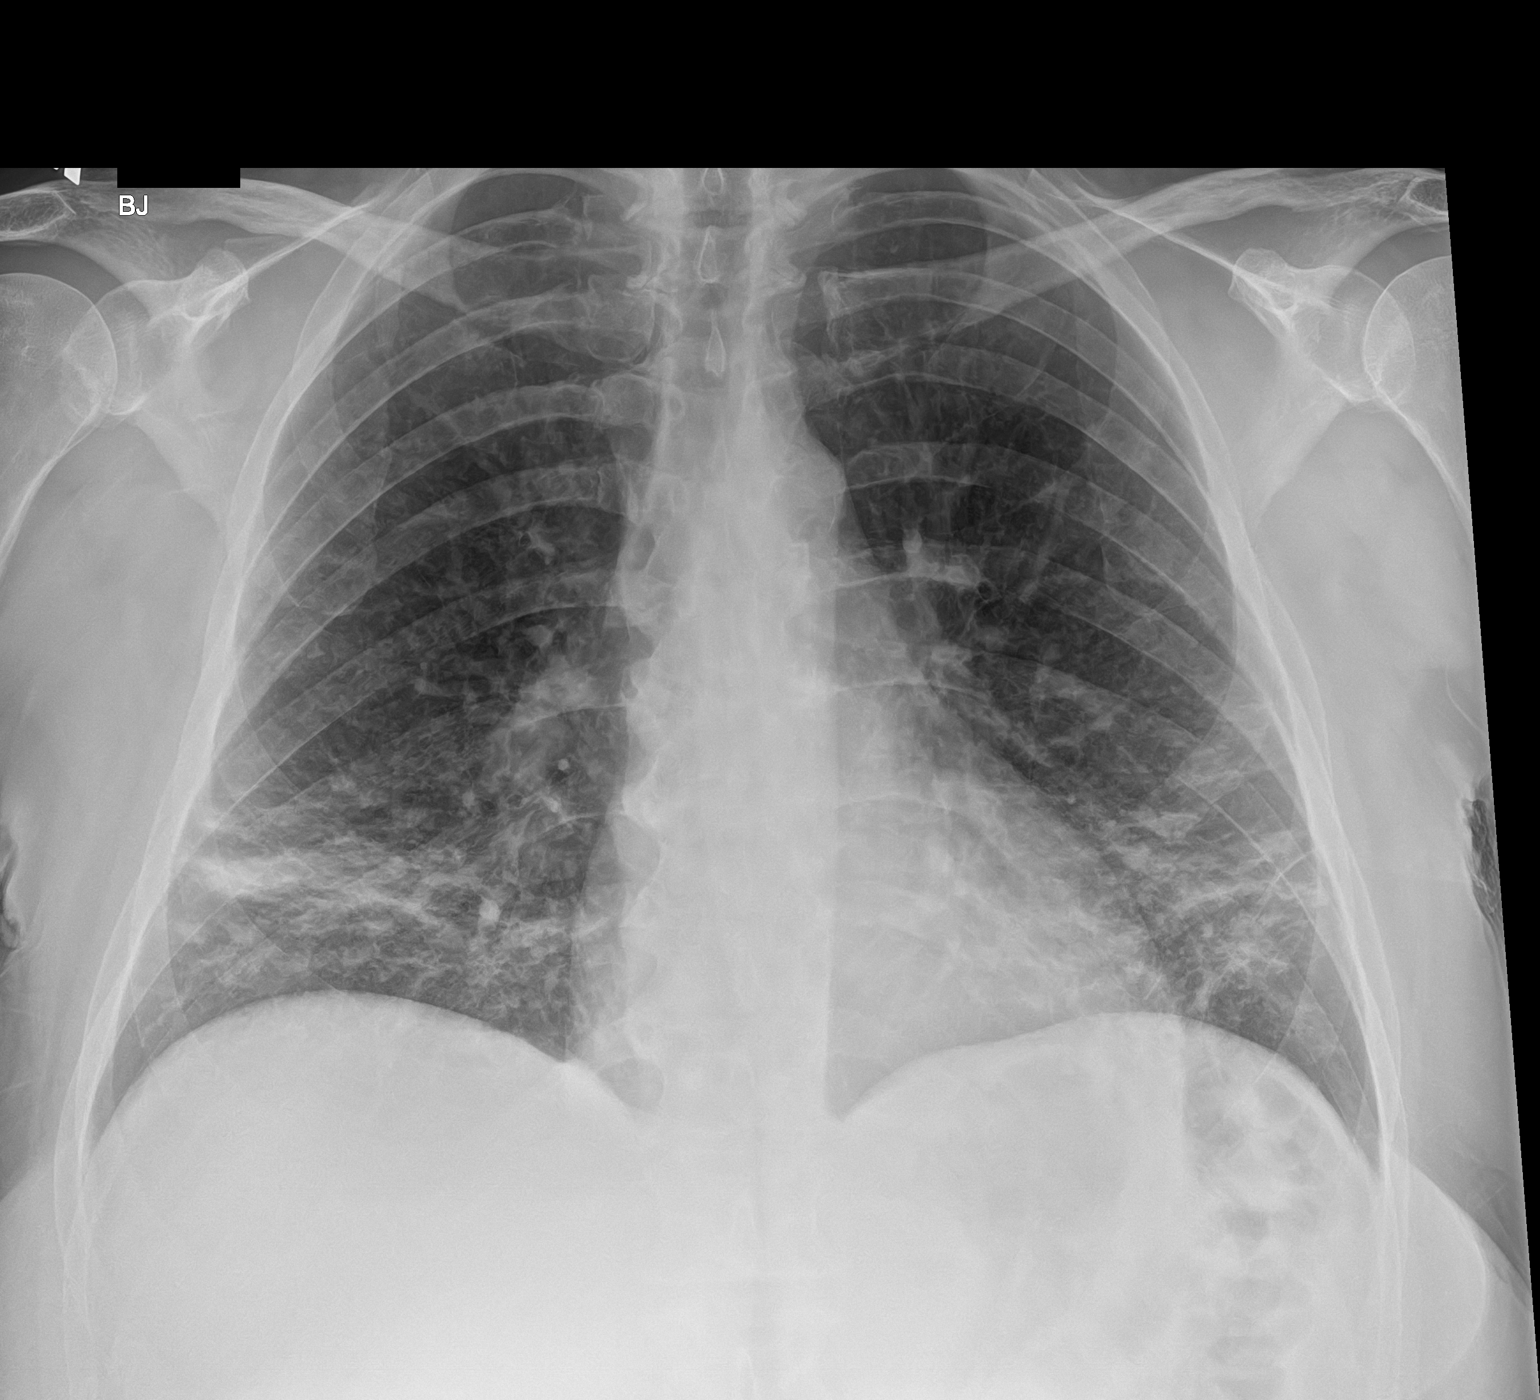

[1 of 1 positions shown; findings below may reference images not displayed]

FINDINGS: Bibasilar infiltrates are identified. The heart size borderline. The
hila and mediastinum are normal. No pneumothorax. No other
abnormalities are identified.
IMPRESSION: Bibasilar infiltrates are consistent with pneumonia. Recommend
short-term follow-up imaging to ensure resolution.
